# Patient Record
Sex: Male | Born: 1960 | Race: White | Hispanic: No | Marital: Married | Smoking: Former smoker
Health system: Southern US, Community
[De-identification: ages and names within clinical notes are randomized; demographics above are authoritative.]

## PROBLEM LIST (undated history)

## (undated) HISTORY — PX: OTHER SURGICAL HISTORY: SHX169

---

## 2017-08-11 DIAGNOSIS — B029 Zoster without complications: Secondary | ICD-10-CM

## 2017-08-11 HISTORY — DX: Zoster without complications: B02.9

## 2019-06-28 ENCOUNTER — Inpatient Hospital Stay (HOSPITAL_COMMUNITY)
Admission: EM | Admit: 2019-06-28 | Discharge: 2019-06-30 | DRG: 247 | Disposition: A | Discharge status: Home / Self Care | Payer: Commercial Managed Care - PPO | Attending: Interventional Cardiology | Admitting: Interventional Cardiology

## 2019-06-28 ENCOUNTER — Encounter (HOSPITAL_COMMUNITY): Payer: Self-pay | Admitting: Physician Assistant

## 2019-06-28 ENCOUNTER — Ambulatory Visit (INDEPENDENT_AMBULATORY_CARE_PROVIDER_SITE_OTHER)
Admission: EM | Admit: 2019-06-28 | Discharge: 2019-06-28 | Disposition: A | Discharge status: Home / Self Care | Payer: Commercial Managed Care - PPO | Source: Home / Self Care

## 2019-06-28 ENCOUNTER — Other Ambulatory Visit: Payer: Self-pay

## 2019-06-28 ENCOUNTER — Emergency Department (HOSPITAL_COMMUNITY): Payer: Commercial Managed Care - PPO

## 2019-06-28 DIAGNOSIS — E785 Hyperlipidemia, unspecified: Secondary | ICD-10-CM | POA: Diagnosis present

## 2019-06-28 DIAGNOSIS — Z720 Tobacco use: Secondary | ICD-10-CM | POA: Diagnosis not present

## 2019-06-28 DIAGNOSIS — Z20828 Contact with and (suspected) exposure to other viral communicable diseases: Secondary | ICD-10-CM | POA: Diagnosis present

## 2019-06-28 DIAGNOSIS — Z95820 Peripheral vascular angioplasty status with implants and grafts: Secondary | ICD-10-CM

## 2019-06-28 DIAGNOSIS — I2510 Atherosclerotic heart disease of native coronary artery without angina pectoris: Secondary | ICD-10-CM | POA: Diagnosis present

## 2019-06-28 DIAGNOSIS — F17210 Nicotine dependence, cigarettes, uncomplicated: Secondary | ICD-10-CM | POA: Diagnosis present

## 2019-06-28 DIAGNOSIS — I214 Non-ST elevation (NSTEMI) myocardial infarction: Secondary | ICD-10-CM | POA: Diagnosis present

## 2019-06-28 DIAGNOSIS — F17200 Nicotine dependence, unspecified, uncomplicated: Secondary | ICD-10-CM

## 2019-06-28 DIAGNOSIS — Z955 Presence of coronary angioplasty implant and graft: Secondary | ICD-10-CM

## 2019-06-28 DIAGNOSIS — E782 Mixed hyperlipidemia: Secondary | ICD-10-CM | POA: Diagnosis not present

## 2019-06-28 DIAGNOSIS — Z823 Family history of stroke: Secondary | ICD-10-CM

## 2019-06-28 DIAGNOSIS — R030 Elevated blood-pressure reading, without diagnosis of hypertension: Secondary | ICD-10-CM

## 2019-06-28 DIAGNOSIS — I10 Essential (primary) hypertension: Secondary | ICD-10-CM | POA: Diagnosis present

## 2019-06-28 DIAGNOSIS — I161 Hypertensive emergency: Secondary | ICD-10-CM

## 2019-06-28 DIAGNOSIS — Z20822 Contact with and (suspected) exposure to covid-19: Secondary | ICD-10-CM

## 2019-06-28 DIAGNOSIS — I200 Unstable angina: Secondary | ICD-10-CM

## 2019-06-28 DIAGNOSIS — R1084 Generalized abdominal pain: Secondary | ICD-10-CM

## 2019-06-28 HISTORY — DX: Essential (primary) hypertension: I10

## 2019-06-28 HISTORY — DX: Tobacco use: Z72.0

## 2019-06-28 HISTORY — DX: Non-ST elevation (NSTEMI) myocardial infarction: I21.4

## 2019-06-28 LAB — BASIC METABOLIC PANEL
Anion gap: 16 — ABNORMAL HIGH (ref 5–15)
BUN: 10 mg/dL (ref 6–20)
CO2: 22 mmol/L (ref 22–32)
Calcium: 9.5 mg/dL (ref 8.9–10.3)
Chloride: 97 mmol/L — ABNORMAL LOW (ref 98–111)
Creatinine, Ser: 0.97 mg/dL (ref 0.61–1.24)
GFR calc Af Amer: >60 mL/min (ref >60)
GFR calc non Af Amer: >60 mL/min (ref >60)
Glucose, Bld: 110 mg/dL — ABNORMAL HIGH (ref 70–99)
Potassium: 3.8 mmol/L (ref 3.5–5.1)
Sodium: 135 mmol/L (ref 135–145)

## 2019-06-28 LAB — HEMOGLOBIN A1C
Hgb A1c MFr Bld: 5.4 % (ref 4.8–5.6)
Mean Plasma Glucose: 108.28 mg/dL

## 2019-06-28 LAB — CBC
HCT: 49.2 % (ref 39.0–52.0)
Hemoglobin: 17.4 g/dL — ABNORMAL HIGH (ref 13.0–17.0)
MCH: 31.6 pg (ref 26.0–34.0)
MCHC: 35.4 g/dL (ref 30.0–36.0)
MCV: 89.5 fL (ref 80.0–100.0)
Platelets: 321 10*3/uL (ref 150–400)
RBC: 5.5 MIL/uL (ref 4.22–5.81)
RDW: 12.6 % (ref 11.5–15.5)
WBC: 13.2 10*3/uL — ABNORMAL HIGH (ref 4.0–10.5)
nRBC: 0 % (ref 0.0–0.2)

## 2019-06-28 LAB — TROPONIN I (HIGH SENSITIVITY)
Troponin I (High Sensitivity): 2662 ng/L (ref <18)
Troponin I (High Sensitivity): 2937 ng/L (ref <18)
Troponin I (High Sensitivity): 3100 ng/L (ref <18)

## 2019-06-28 LAB — SARS CORONAVIRUS 2 BY RT PCR (HOSPITAL ORDER, PERFORMED IN ~~LOC~~ HOSPITAL LAB): SARS Coronavirus 2: NEGATIVE

## 2019-06-28 MED ORDER — ATORVASTATIN CALCIUM 40 MG PO TABS: 40.0000 mg | ORAL_TABLET | Freq: Every day | ORAL | Status: DC

## 2019-06-28 MED ORDER — NITROGLYCERIN 0.4 MG SL SUBL: 0.4000 mg | SUBLINGUAL_TABLET | SUBLINGUAL | Status: DC | PRN

## 2019-06-28 MED ORDER — ALPRAZOLAM 0.25 MG PO TABS: 0.2500 mg | ORAL_TABLET | Freq: Two times a day (BID) | ORAL | Status: DC | PRN

## 2019-06-28 MED ORDER — SODIUM CHLORIDE 0.9 % IV SOLN: 250.0000 mL | INTRAVENOUS | Status: DC | PRN

## 2019-06-28 MED ORDER — CARVEDILOL 3.125 MG PO TABS
3.1250 mg | ORAL_TABLET | Freq: Two times a day (BID) | ORAL | Status: DC
  Administered 2019-06-28 – 2019-06-29 (×2): 3.125 mg via ORAL
  Filled 2019-06-28 (×3): qty 1

## 2019-06-28 MED ORDER — HEPARIN BOLUS VIA INFUSION
4000.0000 [IU] | Freq: Once | INTRAVENOUS | Status: AC
  Administered 2019-06-28: 4000 [IU] via INTRAVENOUS
  Filled 2019-06-28: qty 4000

## 2019-06-28 MED ORDER — ASPIRIN 81 MG PO CHEW
324.0000 mg | CHEWABLE_TABLET | Freq: Once | ORAL | Status: AC
  Administered 2019-06-28: 17:00:00 324 mg via ORAL
  Filled 2019-06-28: qty 4

## 2019-06-28 MED ORDER — SODIUM CHLORIDE 0.9 % WEIGHT BASED INFUSION
3.0000 mL/kg/h | INTRAVENOUS | Status: DC
  Administered 2019-06-29: 06:00:00 3 mL/kg/h via INTRAVENOUS

## 2019-06-28 MED ORDER — ACETAMINOPHEN 325 MG PO TABS: 650.0000 mg | ORAL_TABLET | ORAL | Status: DC | PRN

## 2019-06-28 MED ORDER — SODIUM CHLORIDE 0.9 % WEIGHT BASED INFUSION
1.0000 mL/kg/h | INTRAVENOUS | Status: DC
  Administered 2019-06-29: 08:00:00 1 mL/kg/h via INTRAVENOUS

## 2019-06-28 MED ORDER — SODIUM CHLORIDE 0.9% FLUSH: 3.0000 mL | INTRAVENOUS | Status: DC | PRN

## 2019-06-28 MED ORDER — SODIUM CHLORIDE 0.9% FLUSH: 3.0000 mL | Freq: Two times a day (BID) | INTRAVENOUS | Status: DC

## 2019-06-28 MED ORDER — HEPARIN (PORCINE) 25000 UT/250ML-% IV SOLN
1300.0000 [IU]/h | INTRAVENOUS | Status: DC
  Administered 2019-06-28: 1000 [IU]/h via INTRAVENOUS
  Filled 2019-06-28: qty 250

## 2019-06-28 MED ORDER — ZOLPIDEM TARTRATE 5 MG PO TABS: 5.0000 mg | ORAL_TABLET | Freq: Every evening | ORAL | Status: DC | PRN

## 2019-06-28 MED ORDER — ASPIRIN 81 MG PO CHEW
81.0000 mg | CHEWABLE_TABLET | ORAL | Status: AC
  Administered 2019-06-29: 06:00:00 81 mg via ORAL
  Filled 2019-06-28: qty 1

## 2019-06-28 MED ORDER — ONDANSETRON HCL 4 MG/2ML IJ SOLN: 4.0000 mg | Freq: Four times a day (QID) | INTRAMUSCULAR | Status: DC | PRN

## 2019-06-28 MED ORDER — SODIUM CHLORIDE 0.9% FLUSH: 3.0000 mL | Freq: Once | INTRAVENOUS | Status: DC

## 2019-06-28 NOTE — ED Notes (Signed)
Matthew Stanley, [place] bedside for evaluation. EKG obtained.

## 2019-06-28 NOTE — ED Triage Notes (Signed)
Pt here for intermittent chest pain over the last one and a half weeks, always relieved by gas-x. Pt was constipated and took some ducolax with much relief. Then yesterday pt had severe upper central abdominal pain unrelieved by medications for several hours. Pt vomited last night. No pain in triage.

## 2019-06-28 NOTE — ED Provider Notes (Signed)
[facility] EMERGENCY DEPARTMENT Provider Note   CSN: [id number] Arrival date & time: 06/28/19  1453     History   Chief Complaint Chief Complaint  Patient presents with  . Chest Pain    HPI Matthew Stanley is a 58 y.o. male.     Pt presents to the ED today with CP that occurred 2 days ago.  The pt said he felt like there was something sitting on his chest and he had pain radiating to his left arm.  The pt also had some constipation, took a dulcolax, had a bm and cp went away.  He thought the pain was from the constipation.  Pt went to UC today because he's had intermittent abdominal pain.  He denies any cp or abdominal pain now.  He has not been to a pcp in several years.  He does smoke, but has no other medical problems.  He is hypertensive now.  He was sent over with a nurse from UC.     Past Medical History:  Diagnosis Date  . HTN (hypertension) - new dx 06/28/2019  . NSTEMI (non-ST elevated myocardial infarction) (HCC) 06/28/2019  . Shingles 2019  . Tobacco use 06/28/2019    Patient Active Problem List   Diagnosis Date Noted  . NSTEMI (non-ST elevated myocardial infarction) (HCC) 06/28/2019  . HTN (hypertension) - new dx 06/28/2019  . Tobacco use 06/28/2019    Past Surgical History:  Procedure Laterality Date  . None          Home Medications    Prior to Admission medications   Not on File    Family History Family History  Problem Relation Age of Onset  . Stroke Mother        died in his 72s  . Heart Problems Father        Died in her 48s    Social History Social History   Tobacco Use  . Smoking status: Current Every Day Smoker    Packs/day: 1.00    Years: 40.00    Pack years: 40.00    Types: Cigarettes  . Smokeless tobacco: Never Used  Substance Use Topics  . Alcohol use: Yes    Alcohol/week: 1.0 standard drinks    Types: 1 Cans of beer per week    Comment: rare  . Drug use: Never     Allergies   Patient has no  known allergies.   Review of Systems Review of Systems  Cardiovascular: Positive for chest pain.  Gastrointestinal: Positive for abdominal pain.  All other systems reviewed and are negative.    Physical Exam Updated Vital Signs BP (!) 148/105   Pulse 88   Temp 98.5 F (36.9 C) (Oral)   Resp 15   Ht 5\' 11"  (1.803 m)   Wt 83.9 kg   SpO2 97%   BMI 25.80 kg/m   Physical Exam Vitals signs and nursing note reviewed.  Constitutional:      Appearance: He is well-developed and normal weight.  HENT:     Head: Normocephalic and atraumatic.  Eyes:     Extraocular Movements: Extraocular movements intact.     Pupils: Pupils are equal, round, and reactive to light.  Neck:     Musculoskeletal: Normal range of motion and neck supple.  Cardiovascular:     Rate and Rhythm: Normal rate and regular rhythm.     Heart sounds: Normal heart sounds.  Pulmonary:     Effort: Pulmonary effort is normal.  Breath sounds: Normal breath sounds.  Abdominal:     General: Bowel sounds are normal.     Palpations: Abdomen is soft.  Musculoskeletal: Normal range of motion.  Skin:    General: Skin is warm.     Capillary Refill: Capillary refill takes less than 2 seconds.  Neurological:     General: No focal deficit present.     Mental Status: He is alert and oriented to person, place, and time.  Psychiatric:        Mood and Affect: Mood normal.        Behavior: Behavior normal.      ED Treatments / Results  Labs (all labs ordered are listed, but only abnormal results are displayed) Labs Reviewed  BASIC METABOLIC PANEL - Abnormal; Notable for the following components:      Result Value   Chloride 97 (*)    Glucose, Bld 110 (*)    Anion gap 16 (*)    All other components within normal limits  CBC - Abnormal; Notable for the following components:   WBC 13.2 (*)    Hemoglobin 17.4 (*)    All other components within normal limits  TROPONIN I (HIGH SENSITIVITY) - Abnormal; Notable for  the following components:   Troponin I (High Sensitivity) 2,662 (*)    All other components within normal limits  TROPONIN I (HIGH SENSITIVITY) - Abnormal; Notable for the following components:   Troponin I (High Sensitivity) 2,937 (*)    All other components within normal limits  SARS CORONAVIRUS 2 BY RT PCR (HOSPITAL ORDER, PERFORMED IN [facility])  HEPARIN LEVEL (UNFRACTIONATED)  CBC    EKG EKG Interpretation  Date/Time:  Tuesday June 28 2019 15:01:01 EST Ventricular Rate:  105 PR Interval:  150 QRS Duration: 94 QT Interval:  346 QTC Calculation: 457 R Axis:   81 Text Interpretation: Sinus tachycardia Right atrial enlargement Borderline ECG No significant change since last tracing Confirmed by Jacalyn LefevreHaviland, Moustafa Mossa [id number]) on 06/28/2019 4:53:10 PM   Radiology Dg Chest 2 View  Result Date: 06/28/2019 CLINICAL DATA:  Chest and abdominal pain EXAM: CHEST - 2 VIEW COMPARISON:  None. FINDINGS: The heart size and mediastinal contours are within normal limits. Both lungs are clear. The visualized skeletal structures are unremarkable. Calcified hilar nodes suggesting prior granulomatous disease. IMPRESSION: No active cardiopulmonary disease. Electronically Signed   By: Jasmine PangKim  Fujinaga M.D.   On: 06/28/2019 15:30    Procedures Procedures (including critical care time)  Medications Ordered in ED Medications  sodium chloride flush (NS) 0.9 % injection 3 mL (3 mLs Intravenous Not Given 06/28/19 1707)  heparin ADULT infusion 100 units/mL (25000 units/25450mL sodium chloride 0.45%) (1,000 Units/hr Intravenous New Bag/Given 06/28/19 1741)  aspirin chewable tablet 324 mg (324 mg Oral Given 06/28/19 1715)  heparin bolus via infusion 4,000 Units (4,000 Units Intravenous Bolus from Bag 06/28/19 1743)     Initial Impression / Assessment and Plan / ED Course  I have reviewed the triage vital signs and the nursing notes.  Pertinent labs & imaging results that were available during  my care of the patient were reviewed by me and considered in my medical decision making (see chart for details).     Pt given ASA and started on heparin.  He is currently CP free.  Pt d/w Dr. Eldridge DaceVaranasi (cards) who will see him.  Cards plans on admission with a cath.  CRITICAL CARE Performed by: Jacalyn LefevreJulie Lenay Lovejoy   Total critical care time: 30 minutes  Critical care time was exclusive of separately billable procedures and treating other patients.  Critical care was necessary to treat or prevent imminent or life-threatening deterioration.  Critical care was time spent personally by me on the following activities: development of treatment plan with patient and/or surrogate as well as nursing, discussions with consultants, evaluation of patient's response to treatment, examination of patient, obtaining history from patient or surrogate, ordering and performing treatments and interventions, ordering and review of laboratory studies, ordering and review of radiographic studies, pulse oximetry and re-evaluation of patient's condition.  Final Clinical Impressions(s) / ED Diagnoses   Final diagnoses:  NSTEMI (non-ST elevated myocardial infarction) ([place])  Tobacco abuse  Essential hypertension    ED Discharge Orders    None       Isla Pence, MD 06/28/19 1843

## 2019-06-28 NOTE — ED Notes (Signed)
Dr Gilford Raid notified of critical troponin

## 2019-06-28 NOTE — H&P (Addendum)
Cardiology Admission History and Physical:   Patient ID: Matthew Stanley; MRN: [id number]; DOB: 1960/11/09   Admission date: 06/28/2019  Primary Care Provider: Patient, No Pcp Per Primary Cardiologist: Lance Muss, MD New Primary Electrophysiologist: None    Chief Complaint:  NSTEMI  Patient Profile:   Matthew Stanley is a 58 y.o. male with a history of no recent medical care. Gets yearly cholesterol and BP checks thru his work.   History of Present Illness:   Mr. Breton had a couple of episodes of sharp upper abdominal pain, not exertional. They lasted about 3 hr each, 2 days apart. That was about 10 days ago. Felt cold with the abd pain, had some diaphoresis, had some N&V. 8/10  2 days after the last abdominal pain, he got chest pain, pressure, up to 4-5/10. No change w/ deep inspiration. Pain radiated to the backs of both arms. Pt felt taking deep breaths would help. He was burping a lot, Gas-X would help the pain. No SOB, no diaphoresis, no N&V. No CP in 2 days.  He was having problems w/ constipation. He took Dulcolax, felt much better the next day, after having a BM. He at a large dinner yesterday, T-bone steak.   He developed abd pain at 1 am, he tried Gas-X, Pepto, no help. 8/10, ambulation, position change no help. He had N&V, but no SOB/diaphoresis with this. He felt cold.   He does some yard work at times, mows, etc. No hx CP/abd pain or SOB with this.   No hx LE edema, orthopnea or PND.   No palpitations, no presyncope or syncope.   Past Medical History:  Diagnosis Date  . HTN (hypertension) - new dx 06/28/2019  . NSTEMI (non-ST elevated myocardial infarction) (HCC) 06/28/2019  . Shingles 2019  . Tobacco use 06/28/2019    Past Surgical History:  Procedure Laterality Date  . None       Medications Prior to Admission: Prior to Admission medications   Gas-X, occ Aleve     Allergies:   No Known Allergies  Social History:   Social History    Socioeconomic History  . Marital status: Married    Spouse name: Not on file  . Number of children: Not on file  . Years of education: Not on file  . Highest education level: Not on file  Occupational History  . Occupation: Civil engineer, contracting: Nurse, children's  Social Needs  . Financial resource strain: Not on file  . Food insecurity    Worry: Not on file    Inability: Not on file  . Transportation needs    Medical: Not on file    Non-medical: Not on file  Tobacco Use  . Smoking status: Current Every Day Smoker    Packs/day: 1.00    Years: 40.00    Pack years: 40.00    Types: Cigarettes  . Smokeless tobacco: Never Used  Substance and Sexual Activity  . Alcohol use: Yes    Alcohol/week: 1.0 standard drinks    Types: 1 Cans of beer per week    Comment: rare  . Drug use: Never  . Sexual activity: Not on file  Lifestyle  . Physical activity    Days per week: Not on file    Minutes per session: Not on file  . Stress: Not on file  Relationships  . Social Musician on phone: Not on file    Gets together: Not on file  Attends religious service: Not on file    Active member of club or organization: Not on file    Attends meetings of clubs or organizations: Not on file    Relationship status: Not on file  . Intimate partner violence    Fear of current or ex partner: Not on file    Emotionally abused: Not on file    Physically abused: Not on file    Forced sexual activity: Not on file  Other Topics Concern  . Not on file  Social History Narrative  . Not on file    Family History:  The patient's family history includes Heart Problems in his father; Stroke in his mother.   The patient He indicated that his mother is deceased. He indicated that his father is deceased. He indicated that his sister is deceased. He indicated that his brother is deceased.    ROS:  Please see the history of present illness.  All other ROS reviewed and  negative.     Physical Exam/Data:   Vitals:   06/28/19 1456 06/28/19 1700 06/28/19 1730 06/28/19 1745  BP: (!) 189/109  (!) 165/106 (!) 179/109  Pulse: 98  87 98  Resp: 16  19 15   Temp: 98.5 F (36.9 C)     TempSrc: Oral     SpO2: 100%  97% 97%  Weight:  83.9 kg    Height:  5\' 11"  (1.803 m)     No intake or output data in the 24 hours ending 06/28/19 1814 Filed Weights   06/28/19 1700  Weight: 83.9 kg   Body mass index is 25.8 kg/m.  General:  Well nourished, well developed, male in no acute distress HEENT: normal Lymph: no adenopathy Neck:  JVD not elevated Endocrine:  No thryomegaly Vascular: No carotid bruits; 4/4 extremity pulses 2+ bilaterally  Cardiac:  normal S1, S2; RRR; no murmur, no rub or gallop  Lungs: diffuse insp/exp wheezing bilaterally, no rhonchi or rales  Abd: soft, nontender, no hepatomegaly  Ext: no edema Musculoskeletal:  No deformities, BUE and BLE strength normal and equal Skin: warm and dry  Neuro:  CNs 2-12 intact, no focal abnormalities noted Psych:  Normal affect    EKG:  The ECG was personally reviewed: 11/17, ST, HR 105, Q waves in multiple leads (?significance), no old available for review  Relevant CV Studies:  none  Laboratory Data:  Chemistry Recent Labs  Lab 06/28/19 1524  NA 135  K 3.8  CL 97*  CO2 22  GLUCOSE 110*  BUN 10  CREATININE 0.97  CALCIUM 9.5  GFRNONAA >60  GFRAA >60  ANIONGAP 16*    No results for input(s): PROT, ALBUMIN, AST, ALT, ALKPHOS, BILITOT in the last 168 hours. Hematology Recent Labs  Lab 06/28/19 1524  WBC 13.2*  RBC 5.50  HGB 17.4*  HCT 49.2  MCV 89.5  MCH 31.6  MCHC 35.4  RDW 12.6  PLT 321   Cardiac Enzymes High Sensitivity Troponin:   Recent Labs  Lab 06/28/19 1524  TROPONINIHS 2,662*   Radiology/Studies:  Dg Chest 2 View  Result Date: 06/28/2019 CLINICAL DATA:  Chest and abdominal pain EXAM: CHEST - 2 VIEW COMPARISON:  None. FINDINGS: The heart size and mediastinal  contours are within normal limits. Both lungs are clear. The visualized skeletal structures are unremarkable. Calcified hilar nodes suggesting prior granulomatous disease. IMPRESSION: No active cardiopulmonary disease. Electronically Signed   By: Donavan Foil M.D.   On: 06/28/2019 15:30    Assessment  and Plan:   1. NSTEMI - Admit, continue heparin, ASA - add BB and high-dose statin - add nitrates if has more pain - unclear if abd or chest pain is his angina, may well be the abd pain - add PPI in case GERD playing a role - Cardiac catheterization was discussed with the patient fully. The patient understands that risks include but are not limited to stroke (1 in 1000), death (1 in 1000), kidney failure [usually temporary] (1 in 500), bleeding (1 in 200), allergic reaction [possibly serious] (1 in 200).  The patient understands and is willing to proceed.   - cath in am  2. HTN - New dx, but pt does not ck BP - add BB and see how tolerated  3. tob use - will use nicotine patch - pt wants Chantix, discuss w/ MD - cessation strongly advised   Principal Problem:   NSTEMI (non-ST elevated myocardial infarction) (HCC) Active Problems:   HTN (hypertension) - new dx   Tobacco use     For questions or updates, please contact [facility] Please consult [email or web address] for contact info under Cardiology/STEMI.    Signed, Theodore DemarkRhonda Barrett, PA-C  06/28/2019 6:14 PM   I have examined the patient and reviewed assessment and plan and discussed with patient.  Agree with above as stated.    Sx concerning for unstable angina with elevated troponin.  NSTEMI to be treated with heparin, smoking cessation.  Plan for cath in AM.    Cardiac catheterization was discussed with the patient fully. The patient understands that risks include but are not limited to stroke (1 in 1000), death (1 in 1000), kidney failure [usually temporary] (1 in 500), bleeding (1 in 200), allergic reaction [possibly  serious] (1 in 200).  The patient understands and is willing to proceed.    BP to be controlled as well.  Will add beta blocker.  Will need high dose statin as well.   Wheezing on exam.  I suspect he has some degree of COPD given the long smoking history.  Lance MussJayadeep Fedora Knisely

## 2019-06-28 NOTE — Progress Notes (Signed)
ANTICOAGULATION CONSULT NOTE   Pharmacy Consult for Heparin Indication: chest pain/ACS  No Known Allergies  Patient Measurements: Height: 5\' 11"  (180.3 cm) Weight: 185 lb (83.9 kg) IBW/kg (Calculated) : 75.3 Heparin Dosing Weight: 83.9kg  Vital Signs: Temp: 98.5 F (36.9 C) (11/17 1456) Temp Source: Oral (11/17 1456) BP: 189/109 (11/17 1456) Pulse Rate: 98 (11/17 1456)  Labs: Recent Labs    06/28/19 1524  HGB 17.4*  HCT 49.2  PLT 321  CREATININE 0.97  TROPONINIHS 2,662*    Estimated Creatinine Clearance: 87.3 mL/min (by C-G formula based on SCr of 0.97 mg/dL).   Medical History: No past medical history on file.  Medications:  Scheduled:  . heparin  4,000 Units Intravenous Once  . sodium chloride flush  3 mL Intravenous Once    Assessment: Patient is a 62 yom that presented to the ED with chest pain. The patient has not followed up with his pcp in years. The patient was found to have a NSTEMI and pharmacy has been asked to dose heparin at this time.   Goal of Therapy:  Heparin level 0.3-0.7 units/ml Monitor platelets by anticoagulation protocol: Yes   Plan:  - Bolus heparin 4000 units IV x 1 dose  - Follow with heparin drip @ 1000 units/hr  - Check heparin level 6 hours after start of heparin  - Monitor for s/s of bleeding and CBC daily while on heparin   Duanne Limerick PharmD. BCPS 06/28/2019,5:27 PM

## 2019-06-28 NOTE — ED Triage Notes (Signed)
Patient presents to Urgent Care with complaints of centralized chest pain intermittently since the past week and a half. Patient reports the first time it happened was in the middle of the night and it caused him to vomit violently. Pt has been using gas-x and that helped at first but then it stopped helping, pt still has the chest pains intermittently, has been using other gas/abdominal pain related otc meds, pt did vomit this morning.

## 2019-06-28 NOTE — ED Provider Notes (Signed)
Matthew Stanley   MRN: [id number] DOB: 1961/04/23  Subjective:   Matthew Stanley is a 58 y.o. male presenting for 1 week history of generalized intermittent abdominal pain.  Symptoms then moved to both of his arms posteriorly and then he started to have moderate chest pressure type chest pain across his entire lower chest.  Symptoms were happening at bedtime or waking him up out of his sleep and would last for a few hours.  He subsequently started to take Gas-X and would start to get relief after about 15 to 20 minutes.  However the symptoms keep recurring.  Last episode with chest pain was about 2 days ago.  However he did have recurrent abdominal pain yesterday which resolved again with using Gas-X.  Of note, patient denies current chest or abdominal pain at this moment.  Patient is not currently taking any medications.  Denies past medical history.  Denies past surgical history.  Family history is positive for stroke with his father in his 57s, mother had possible patent foramen ovale.  Patient smokes 1 pack/day, has occasional/rare alcohol use.  ROS Denies fever, headache, dizziness, confusion, vision change, runny or stuffy nose, cough, throat pain, shortness of breath, wheezing, nausea, vomiting, diaphoresis, weakness.  Objective:   Vitals: BP (!) 179/108 (BP Location: Left Arm)   Pulse 94   Temp 98.2 F (36.8 C) (Oral)   Resp 16   SpO2 100%   BP Readings from Last 3 Encounters:  06/28/19 (!) 179/108   Blood pressure was 208/112 on initial check.  Physical Exam Constitutional:      General: He is not in acute distress.    Appearance: Normal appearance. He is well-developed. He is not ill-appearing, toxic-appearing or diaphoretic.  HENT:     Head: Normocephalic and atraumatic.     Right Ear: External ear normal.     Left Ear: External ear normal.     Nose: Nose normal.     Mouth/Throat:     Mouth: Mucous membranes are moist.     Pharynx: Oropharynx is clear.   Eyes:     General: No scleral icterus.    Extraocular Movements: Extraocular movements intact.     Pupils: Pupils are equal, round, and reactive to light.  Cardiovascular:     Rate and Rhythm: Normal rate and regular rhythm.     Heart sounds: Normal heart sounds. No murmur. No friction rub. No gallop.   Pulmonary:     Effort: Pulmonary effort is normal. No respiratory distress.     Breath sounds: Normal breath sounds. No stridor. No wheezing, rhonchi or rales.  Abdominal:     General: Bowel sounds are normal. There is no distension.     Palpations: Abdomen is soft. There is no mass.     Tenderness: There is no abdominal tenderness. There is no guarding or rebound.  Musculoskeletal:     Right lower leg: No edema.     Left lower leg: No edema.  Skin:    General: Skin is warm and dry.  Neurological:     Mental Status: He is alert and oriented to person, place, and time.     Cranial Nerves: No cranial nerve deficit.     Motor: No weakness.  Psychiatric:        Mood and Affect: Mood normal. Mood is not anxious.        Behavior: Behavior normal. Behavior is not agitated.        Thought Content: Thought content normal.  Judgment: Judgment normal.     ED ECG REPORT    Date: 06/28/2019  Rate: 92bpm  Rhythm: normal sinus rhythm  QRS Axis: normal  Intervals: normal  ST/T Wave abnormalities: nonspecific T wave changes  Conduction Disutrbances:none  Narrative Interpretation: Sinus rhythm at 92 bpm, T wave inversion at aVL, possible LVH.  Old EKG Reviewed: none available  I have personally reviewed the EKG tracing and agree with the computerized printout as noted.   Assessment and Plan :   1. Unstable angina (HCC)   2. Generalized abdominal pain   3. Hypertensive emergency   4. Elevated blood pressure reading without diagnosis of hypertension     Case and EKG reviewed with Dr. Tracie Harrier. Redirected patient to the emergency room with RN Stacie Acres transporting patient directly.   Patient is not having current chest or abdominal pain and therefore did not warrant transport by ambulance/EMS.  He has concerning symptoms for impending ACS, thoracic aneurysm, abdominal aneurysm or other acute cardiopulmonary event that needs immediate attention given his severely elevated blood pressure.   Wallis Bamberg, PA-C 06/28/19 1455

## 2019-06-28 NOTE — ED Notes (Signed)
Patient is being discharged from the [facility] and sent to the Emergency Department via wheelchair by staff. Per Bess Harvest, patient is stable but in need of higher level of care due to intermittent chest pain that radiates to both arms, nausea, vomiting, hx of smoking and significantly elevated BP w/ no history of same. Patient is aware and verbalizes understanding of plan of care.  Vitals:   06/28/19 1425 06/28/19 1436  BP: (S) (!) 208/112 (!) 179/108  Pulse: 91 94  Resp: 16   Temp: 98.2 F (36.8 C)   SpO2: 100% 100%

## 2019-06-29 ENCOUNTER — Inpatient Hospital Stay (HOSPITAL_COMMUNITY): Payer: Commercial Managed Care - PPO

## 2019-06-29 ENCOUNTER — Encounter (HOSPITAL_COMMUNITY): Payer: Self-pay | Admitting: General Practice

## 2019-06-29 ENCOUNTER — Inpatient Hospital Stay (HOSPITAL_COMMUNITY)
Admission: EM | Disposition: A | Discharge status: Home / Self Care | Payer: Self-pay | Source: Home / Self Care | Attending: Interventional Cardiology

## 2019-06-29 DIAGNOSIS — I2510 Atherosclerotic heart disease of native coronary artery without angina pectoris: Secondary | ICD-10-CM

## 2019-06-29 DIAGNOSIS — I214 Non-ST elevation (NSTEMI) myocardial infarction: Secondary | ICD-10-CM

## 2019-06-29 HISTORY — PX: CORONARY THROMBECTOMY: CATH118304

## 2019-06-29 HISTORY — PX: LEFT HEART CATH AND CORONARY ANGIOGRAPHY: CATH118249

## 2019-06-29 HISTORY — PX: CORONARY STENT INTERVENTION: CATH118234

## 2019-06-29 LAB — COMPREHENSIVE METABOLIC PANEL
ALT: 30 U/L (ref 0–44)
AST: 32 U/L (ref 15–41)
Albumin: 3.4 g/dL — ABNORMAL LOW (ref 3.5–5.0)
Alkaline Phosphatase: 56 U/L (ref 38–126)
Anion gap: 11 (ref 5–15)
BUN: 12 mg/dL (ref 6–20)
CO2: 24 mmol/L (ref 22–32)
Calcium: 8.8 mg/dL — ABNORMAL LOW (ref 8.9–10.3)
Chloride: 102 mmol/L (ref 98–111)
Creatinine, Ser: 1.01 mg/dL (ref 0.61–1.24)
GFR calc Af Amer: >60 mL/min (ref >60)
GFR calc non Af Amer: >60 mL/min (ref >60)
Glucose, Bld: 91 mg/dL (ref 70–99)
Potassium: 4.2 mmol/L (ref 3.5–5.1)
Sodium: 137 mmol/L (ref 135–145)
Total Bilirubin: 1.2 mg/dL (ref 0.3–1.2)
Total Protein: 6.1 g/dL — ABNORMAL LOW (ref 6.5–8.1)

## 2019-06-29 LAB — ECHOCARDIOGRAM COMPLETE
Height: 71 in
Weight: 2927.71 oz

## 2019-06-29 LAB — HEPARIN LEVEL (UNFRACTIONATED)
Heparin Unfractionated: 0.15 IU/mL — ABNORMAL LOW (ref 0.30–0.70)
Heparin Unfractionated: <0.1 IU/mL — ABNORMAL LOW (ref 0.30–0.70)
Heparin Unfractionated: <0.1 IU/mL — ABNORMAL LOW (ref 0.30–0.70)

## 2019-06-29 LAB — CBC
HCT: 44.6 % (ref 39.0–52.0)
Hemoglobin: 15.7 g/dL (ref 13.0–17.0)
MCH: 31.2 pg (ref 26.0–34.0)
MCHC: 35.2 g/dL (ref 30.0–36.0)
MCV: 88.7 fL (ref 80.0–100.0)
Platelets: 292 10*3/uL (ref 150–400)
RBC: 5.03 MIL/uL (ref 4.22–5.81)
RDW: 12.6 % (ref 11.5–15.5)
WBC: 9.1 10*3/uL (ref 4.0–10.5)
nRBC: 0 % (ref 0.0–0.2)

## 2019-06-29 LAB — TROPONIN I (HIGH SENSITIVITY): Troponin I (High Sensitivity): 3391 ng/L (ref <18)

## 2019-06-29 LAB — POCT ACTIVATED CLOTTING TIME: Activated Clotting Time: 461 seconds

## 2019-06-29 LAB — HIV ANTIBODY (ROUTINE TESTING W REFLEX): HIV Screen 4th Generation wRfx: NONREACTIVE

## 2019-06-29 SURGERY — LEFT HEART CATH AND CORONARY ANGIOGRAPHY
Anesthesia: LOCAL

## 2019-06-29 MED ORDER — CARVEDILOL 6.25 MG PO TABS
6.2500 mg | ORAL_TABLET | Freq: Two times a day (BID) | ORAL | Status: DC
  Administered 2019-06-29 – 2019-06-30 (×2): 6.25 mg via ORAL
  Filled 2019-06-29 (×2): qty 1

## 2019-06-29 MED ORDER — ATORVASTATIN CALCIUM 80 MG PO TABS
80.0000 mg | ORAL_TABLET | Freq: Every day | ORAL | Status: DC
  Administered 2019-06-29: 80 mg via ORAL
  Filled 2019-06-29: qty 1

## 2019-06-29 MED ORDER — ONDANSETRON HCL 4 MG/2ML IJ SOLN: 4.0000 mg | Freq: Four times a day (QID) | INTRAMUSCULAR | Status: DC | PRN

## 2019-06-29 MED ORDER — HEPARIN BOLUS VIA INFUSION
3000.0000 [IU] | Freq: Once | INTRAVENOUS | Status: AC
  Administered 2019-06-29: 07:00:00 3000 [IU] via INTRAVENOUS
  Filled 2019-06-29: qty 3000

## 2019-06-29 MED ORDER — TIROFIBAN (AGGRASTAT) BOLUS VIA INFUSION
INTRAVENOUS | Status: DC | PRN
  Administered 2019-06-29: 2075 ug via INTRAVENOUS

## 2019-06-29 MED ORDER — HEPARIN (PORCINE) IN NACL 1000-0.9 UT/500ML-% IV SOLN
INTRAVENOUS | Status: AC
  Filled 2019-06-29: qty 1000

## 2019-06-29 MED ORDER — TIROFIBAN HCL IN NACL 5-0.9 MG/100ML-% IV SOLN
INTRAVENOUS | Status: AC | PRN
  Administered 2019-06-29: 0.15 ug/kg/min via INTRAVENOUS

## 2019-06-29 MED ORDER — VERAPAMIL HCL 2.5 MG/ML IV SOLN
INTRAVENOUS | Status: DC | PRN
  Administered 2019-06-29: 10 mL via INTRA_ARTERIAL

## 2019-06-29 MED ORDER — HEPARIN SODIUM (PORCINE) 1000 UNIT/ML IJ SOLN
INTRAMUSCULAR | Status: DC | PRN
  Administered 2019-06-29: 4500 [IU] via INTRAVENOUS

## 2019-06-29 MED ORDER — BIVALIRUDIN TRIFLUOROACETATE 250 MG IV SOLR
INTRAVENOUS | Status: AC
  Filled 2019-06-29: qty 250

## 2019-06-29 MED ORDER — SODIUM CHLORIDE 0.9 % IV SOLN: 250.0000 mL | INTRAVENOUS | Status: DC | PRN

## 2019-06-29 MED ORDER — SODIUM CHLORIDE 0.9% FLUSH: 3.0000 mL | Freq: Two times a day (BID) | INTRAVENOUS | Status: DC

## 2019-06-29 MED ORDER — TICAGRELOR 90 MG PO TABS
90.0000 mg | ORAL_TABLET | Freq: Two times a day (BID) | ORAL | Status: DC
  Administered 2019-06-29 – 2019-06-30 (×2): 90 mg via ORAL
  Filled 2019-06-29 (×2): qty 1

## 2019-06-29 MED ORDER — TICAGRELOR 90 MG PO TABS
ORAL_TABLET | ORAL | Status: AC
  Filled 2019-06-29: qty 2

## 2019-06-29 MED ORDER — SODIUM CHLORIDE 0.9 % IV SOLN
INTRAVENOUS | Status: DC
  Administered 2019-06-29 (×2): via INTRAVENOUS

## 2019-06-29 MED ORDER — VERAPAMIL HCL 2.5 MG/ML IV SOLN
INTRAVENOUS | Status: AC
  Filled 2019-06-29: qty 2

## 2019-06-29 MED ORDER — MIDAZOLAM HCL 2 MG/2ML IJ SOLN
INTRAMUSCULAR | Status: AC
  Filled 2019-06-29: qty 2

## 2019-06-29 MED ORDER — ACETAMINOPHEN 325 MG PO TABS: 650.0000 mg | ORAL_TABLET | ORAL | Status: DC | PRN

## 2019-06-29 MED ORDER — SODIUM CHLORIDE 0.9 % IV SOLN: 1.7500 mg/kg/h | INTRAVENOUS | Status: AC

## 2019-06-29 MED ORDER — MIDAZOLAM HCL 2 MG/2ML IJ SOLN
INTRAMUSCULAR | Status: DC | PRN
  Administered 2019-06-29: 1 mg via INTRAVENOUS
  Administered 2019-06-29: 2 mg via INTRAVENOUS

## 2019-06-29 MED ORDER — TIROFIBAN HCL IN NACL 5-0.9 MG/100ML-% IV SOLN
0.1500 ug/kg/min | INTRAVENOUS | Status: AC
  Administered 2019-06-29 – 2019-06-30 (×3): 0.15 ug/kg/min via INTRAVENOUS
  Filled 2019-06-29 (×3): qty 100

## 2019-06-29 MED ORDER — NITROGLYCERIN 1 MG/10 ML FOR IR/CATH LAB
INTRA_ARTERIAL | Status: AC
  Filled 2019-06-29: qty 10

## 2019-06-29 MED ORDER — TIROFIBAN HCL IN NACL 5-0.9 MG/100ML-% IV SOLN
INTRAVENOUS | Status: AC
  Filled 2019-06-29: qty 100

## 2019-06-29 MED ORDER — FENTANYL CITRATE (PF) 100 MCG/2ML IJ SOLN
INTRAMUSCULAR | Status: DC | PRN
  Administered 2019-06-29: 25 ug via INTRAVENOUS
  Administered 2019-06-29: 50 ug via INTRAVENOUS

## 2019-06-29 MED ORDER — LIDOCAINE HCL (PF) 1 % IJ SOLN
INTRAMUSCULAR | Status: DC | PRN
  Administered 2019-06-29: 2 mL

## 2019-06-29 MED ORDER — TICAGRELOR 90 MG PO TABS
ORAL_TABLET | ORAL | Status: DC | PRN
  Administered 2019-06-29: 180 mg via ORAL

## 2019-06-29 MED ORDER — SODIUM CHLORIDE 0.9% FLUSH: 3.0000 mL | INTRAVENOUS | Status: DC | PRN

## 2019-06-29 MED ORDER — HEPARIN SODIUM (PORCINE) 1000 UNIT/ML IJ SOLN
INTRAMUSCULAR | Status: AC
  Filled 2019-06-29: qty 1

## 2019-06-29 MED ORDER — LABETALOL HCL 5 MG/ML IV SOLN: 10.0000 mg | INTRAVENOUS | Status: AC | PRN

## 2019-06-29 MED ORDER — DIAZEPAM 5 MG PO TABS: 5.0000 mg | ORAL_TABLET | ORAL | Status: DC | PRN

## 2019-06-29 MED ORDER — NITROGLYCERIN 1 MG/10 ML FOR IR/CATH LAB
INTRA_ARTERIAL | Status: DC | PRN
  Administered 2019-06-29 (×4): 200 ug via INTRACORONARY

## 2019-06-29 MED ORDER — HYDRALAZINE HCL 20 MG/ML IJ SOLN: 10.0000 mg | INTRAMUSCULAR | Status: AC | PRN

## 2019-06-29 MED ORDER — IOHEXOL 350 MG/ML SOLN
INTRAVENOUS | Status: DC | PRN
  Administered 2019-06-29: 285 mL

## 2019-06-29 MED ORDER — LIDOCAINE HCL (PF) 1 % IJ SOLN
INTRAMUSCULAR | Status: AC
  Filled 2019-06-29: qty 30

## 2019-06-29 MED ORDER — ASPIRIN 81 MG PO CHEW
81.0000 mg | CHEWABLE_TABLET | Freq: Every day | ORAL | Status: DC
  Administered 2019-06-29 – 2019-06-30 (×2): 81 mg via ORAL
  Filled 2019-06-29 (×2): qty 1

## 2019-06-29 MED ORDER — BIVALIRUDIN BOLUS VIA INFUSION - CUPID
INTRAVENOUS | Status: DC | PRN
  Administered 2019-06-29: 62.25 mg via INTRAVENOUS

## 2019-06-29 MED ORDER — FENTANYL CITRATE (PF) 100 MCG/2ML IJ SOLN
INTRAMUSCULAR | Status: AC
  Filled 2019-06-29: qty 2

## 2019-06-29 MED ORDER — HEPARIN (PORCINE) IN NACL 1000-0.9 UT/500ML-% IV SOLN
INTRAVENOUS | Status: DC | PRN
  Administered 2019-06-29 (×3): 500 mL

## 2019-06-29 MED ORDER — SODIUM CHLORIDE 0.9 % IV SOLN
INTRAVENOUS | Status: AC | PRN
  Administered 2019-06-29: 125 mL/h via INTRAVENOUS

## 2019-06-29 MED ORDER — SODIUM CHLORIDE 0.9 % IV SOLN
INTRAVENOUS | Status: AC | PRN
  Administered 2019-06-29: 1.75 mg/kg/h via INTRAVENOUS
  Administered 2019-06-29: 16:00:00 1.75 mg/kg/h
  Administered 2019-06-29: 1.75 mg/kg/h via INTRAVENOUS

## 2019-06-29 SURGICAL SUPPLY — 22 items
BALLN SAPPHIRE 2.0X12 (BALLOONS) ×2
BALLN SAPPHIRE 2.5X12 (BALLOONS) ×2
BALLN SAPPHIRE ~~LOC~~ 2.25X15 (BALLOONS) ×2 IMPLANT
BALLOON SAPPHIRE 2.0X12 (BALLOONS) ×1 IMPLANT
BALLOON SAPPHIRE 2.5X12 (BALLOONS) ×1 IMPLANT
CATH EXTRAC PRONTO 5.5F 138CM (CATHETERS) ×2 IMPLANT
CATH INFINITI 5FR ANG PIGTAIL (CATHETERS) ×2 IMPLANT
CATH OPTITORQUE TIG 4.0 5F (CATHETERS) ×2 IMPLANT
CATH VISTA GUIDE 6FR XB3.5 (CATHETERS) ×2 IMPLANT
DEVICE RAD COMP TR BAND LRG (VASCULAR PRODUCTS) ×2 IMPLANT
GLIDESHEATH SLEND SS 6F .021 (SHEATH) ×2 IMPLANT
GUIDEWIRE INQWIRE 1.5J.035X260 (WIRE) ×1 IMPLANT
INQWIRE 1.5J .035X260CM (WIRE) ×2
KIT ENCORE 26 ADVANTAGE (KITS) ×2 IMPLANT
KIT HEART LEFT (KITS) ×2 IMPLANT
PACK CARDIAC CATHETERIZATION (CUSTOM PROCEDURE TRAY) ×2 IMPLANT
STENT RESOLUTE ONYX 2.25X38 (Permanent Stent) ×2 IMPLANT
TRANSDUCER W/STOPCOCK (MISCELLANEOUS) ×2 IMPLANT
TUBING CIL FLEX 10 FLL-RA (TUBING) ×2 IMPLANT
WIRE COUGAR XT STRL 190CM (WIRE) ×2 IMPLANT
WIRE HI TORQ WHISPER MS 190CM (WIRE) ×2 IMPLANT
WIRE PT2 MS 185 (WIRE) ×2 IMPLANT

## 2019-06-29 NOTE — Progress Notes (Signed)
ANTICOAGULATION CONSULT NOTE - Follow Up Consult  Pharmacy Consult for heparin Indication: NSTEMI  Labs: Recent Labs    06/28/19 1524 06/28/19 1706 06/28/19 2046 06/28/19 2343 06/29/19 0510  HGB 17.4*  --   --   --  15.7  HCT 49.2  --   --   --  44.6  PLT 321  --   --   --  292  HEPARINUNFRC  --   --   --  0.15* <0.10*  CREATININE 0.97  --   --   --  1.01  TROPONINIHS 2,662* 2,937* 3,100* 3,391*  --     Assessment: 58yo male subtherapeutic on heparin with initial dosing for NSTEMI; no gtt issues or signs of bleeding per RN; has been running smoothly since pt arrived to floor from ED.  Goal of Therapy:  Heparin level 0.3-0.7 units/ml   Plan:  Will rebolus with heparin 3000 units and increase heparin gtt by 4 units/kg/hr to 1300 units/hr and check level in 6 hours.    Wynona Neat, PharmD, BCPS  06/29/2019,6:29 AM

## 2019-06-29 NOTE — Progress Notes (Signed)
ANTICOAGULATION CONSULT NOTE   Pharmacy Consult for Heparin Indication: chest pain/ACS  No Known Allergies  Patient Measurements: Height: 5\' 11"  (180.3 cm) Weight: 182 lb 15.7 oz (83 kg) IBW/kg (Calculated) : 75.3 Heparin Dosing Weight: 83 kg  Vital Signs: Temp: 99.1 F (37.3 C) (11/17 2254) Temp Source: Oral (11/17 2254) BP: 147/98 (11/17 2254) Pulse Rate: 85 (11/17 2254)  Labs: Recent Labs    06/28/19 1524 06/28/19 1706 06/28/19 2046 06/28/19 2343  HGB 17.4*  --   --   --   HCT 49.2  --   --   --   PLT 321  --   --   --   HEPARINUNFRC  --   --   --  0.15*  CREATININE 0.97  --   --   --   TROPONINIHS 2,662* 2,937* 3,100*  --     Estimated Creatinine Clearance: 87.3 mL/min (by C-G formula based on SCr of 0.97 mg/dL).   Medical History: Past Medical History:  Diagnosis Date  . HTN (hypertension) - new dx 06/28/2019  . NSTEMI (non-ST elevated myocardial infarction) ([place]) 06/28/2019  . Shingles 2019  . Tobacco use 06/28/2019    Assessment: Patient is a 28 yom that presented to the ED with chest pain. The patient has not followed up with his pcp in years. The patient was found to have a NSTEMI and pharmacy has been asked to dose heparin at this time.   Heparin level subtherapeutic (0.15). Talked with RN on floor and he said when pt came up from ED, the heparin line was clamped so unsure if gtt was running appropriately down in the ED. Pt transferred to unit ~2230  Goal of Therapy:  Heparin level 0.3-0.7 units/ml Monitor platelets by anticoagulation protocol: Yes   Plan:  Continue heparin 1000 units/hr Check heparin level again 6 hours from when pt transferred to floor  Sherlon Handing, PharmD, BCPS Please see amion for complete clinical pharmacist phone list 06/29/2019,12:11 AM

## 2019-06-29 NOTE — Progress Notes (Signed)
Echocardiogram 2D Echocardiogram has been performed.  Oneal Deputy Navika Hoopes 06/29/2019, 9:15 AM

## 2019-06-29 NOTE — Progress Notes (Addendum)
Progress Note  Patient Name: Matthew Stanley Date of Encounter: 06/29/2019  Primary Cardiologist: Lance Muss, MD   Subjective   Waiting for cath  Inpatient Medications    Scheduled Meds: . atorvastatin  40 mg Oral q1800  . carvedilol  3.125 mg Oral BID WC  . sodium chloride flush  3 mL Intravenous Once  . sodium chloride flush  3 mL Intravenous Q12H   Continuous Infusions: . sodium chloride    . sodium chloride    . sodium chloride 1 mL/kg/hr (06/29/19 0732)  . heparin 1,300 Units/hr (06/29/19 0653)   PRN Meds: sodium chloride, sodium chloride, acetaminophen, ALPRAZolam, nitroGLYCERIN, ondansetron (ZOFRAN) IV, sodium chloride flush, sodium chloride flush, zolpidem   Vital Signs    Vitals:   06/28/19 2254 06/29/19 0558 06/29/19 0752 06/29/19 0827  BP: (!) 147/98 (!) 151/102 (!) 149/107 (!) 149/107  Pulse: 85 80 71 71  Resp: (!) 21 19 14    Temp: 99.1 F (37.3 C) 98.4 F (36.9 C) 98.4 F (36.9 C)   TempSrc: Oral Oral Oral   SpO2: 97% 96% 99%   Weight: 83 kg     Height: 5\' 11"  (1.803 m)       Intake/Output Summary (Last 24 hours) at 06/29/2019 0943 Last data filed at 06/29/2019 0600 Gross per 24 hour  Intake -  Output 700 ml  Net -700 ml   Last 3 Weights 06/28/2019 06/28/2019  Weight (lbs) 182 lb 15.7 oz 185 lb  Weight (kg) 83 kg 83.915 kg      Telemetry    SR - Personally Reviewed  ECG    SR with normal EKG - Personally Reviewed  Physical Exam  Exam per Dr. 06/30/2019 GEN: No acute distress.   Neck: No JVD Cardiac: RRR, no murmurs, rubs, or gallops.  Respiratory: Clear to auscultation bilaterally. GI: Soft, nontender, non-distended  MS: No edema; No deformity. Neuro:  Nonfocal  Psych: Normal affect   Labs    High Sensitivity Troponin:   Recent Labs  Lab 06/28/19 1524 06/28/19 1706 06/28/19 2046 06/28/19 2343  TROPONINIHS 2,662* 2,937* 3,100* 3,391*      Chemistry Recent Labs  Lab 06/28/19 1524 06/29/19 0510  NA 135 137   K 3.8 4.2  CL 97* 102  CO2 22 24  GLUCOSE 110* 91  BUN 10 12  CREATININE 0.97 1.01  CALCIUM 9.5 8.8*  PROT  --  6.1*  ALBUMIN  --  3.4*  AST  --  32  ALT  --  30  ALKPHOS  --  56  BILITOT  --  1.2  GFRNONAA >60 >60  GFRAA >60 >60  ANIONGAP 16* 11     Hematology Recent Labs  Lab 06/28/19 1524 06/29/19 0510  WBC 13.2* 9.1  RBC 5.50 5.03  HGB 17.4* 15.7  HCT 49.2 44.6  MCV 89.5 88.7  MCH 31.6 31.2  MCHC 35.4 35.2  RDW 12.6 12.6  PLT 321 292    BNPNo results for input(s): BNP, PROBNP in the last 168 hours.   DDimer No results for input(s): DDIMER in the last 168 hours.   Radiology    Dg Chest 2 View  Result Date: 06/28/2019 CLINICAL DATA:  Chest and abdominal pain EXAM: CHEST - 2 VIEW COMPARISON:  None. FINDINGS: The heart size and mediastinal contours are within normal limits. Both lungs are clear. The visualized skeletal structures are unremarkable. Calcified hilar nodes suggesting prior granulomatous disease. IMPRESSION: No active cardiopulmonary disease. Electronically Signed   By: 07/01/19  Francoise Ceo M.D.   On: 06/28/2019 15:30    Cardiac Studies   Echo pending cardiac cath pending  Patient Profile     58 y.o. male with a history of no recent medical care. Gets yearly cholesterol and BP checks thru his work, now admitted with chest pain/abd pain and troponin 3391.     Assessment & Plan    NSTEMI for cath today on IV heparin and ASA, on BB and high dose statin.  PPI added with abd pain.    HTN new pt does not see any health care provider.  Tobacco use  - discussed stopping   HLD  Lipids pending on statin now.    For questions or updates, please contact [place] Please consult [email or web address] for contact info under        Signed, Cecilie Kicks, NP  06/29/2019, 9:43 AM    I have examined the patient and reviewed assessment and plan and discussed with patient.  Agree with above as stated.    No further sx like he had that brought him to ER.   Troponins elevated but flat.  Await cath.  Likely will try Chantix at discharge to stop smoking.  Increase Coreg for HTN. High dose statin.  Larae Grooms

## 2019-06-29 NOTE — Interval H&P Note (Signed)
Cath Lab Visit (complete for each Cath Lab visit)  Clinical Evaluation Leading to the Procedure:   ACS: No.  Non-ACS:    Anginal Classification: CCS IV  Anti-ischemic medical therapy: Minimal Therapy (1 class of medications)  Non-Invasive Test Results: No non-invasive testing performed  Prior CABG: No previous CABG      History and Physical Interval Note:  06/29/2019 12:57 PM  Matthew Stanley  has presented today for surgery, with the diagnosis of NSTEMI.  The various methods of treatment have been discussed with the patient and family. After consideration of risks, benefits and other options for treatment, the patient has consented to  Procedure(s): LEFT HEART CATH AND CORONARY ANGIOGRAPHY (N/A) as a surgical intervention.  The patient's history has been reviewed, patient examined, no change in status, stable for surgery.  I have reviewed the patient's chart and labs.  Questions were answered to the patient's satisfaction.     Shelva Majestic

## 2019-06-29 NOTE — H&P (View-Only) (Signed)
Progress Note  Patient Name: Matthew Stanley Date of Encounter: 06/29/2019  Primary Cardiologist: Lance Muss, MD   Subjective   Waiting for cath  Inpatient Medications    Scheduled Meds: . atorvastatin  40 mg Oral q1800  . carvedilol  3.125 mg Oral BID WC  . sodium chloride flush  3 mL Intravenous Once  . sodium chloride flush  3 mL Intravenous Q12H   Continuous Infusions: . sodium chloride    . sodium chloride    . sodium chloride 1 mL/kg/hr (06/29/19 0732)  . heparin 1,300 Units/hr (06/29/19 0653)   PRN Meds: sodium chloride, sodium chloride, acetaminophen, ALPRAZolam, nitroGLYCERIN, ondansetron (ZOFRAN) IV, sodium chloride flush, sodium chloride flush, zolpidem   Vital Signs    Vitals:   06/28/19 2254 06/29/19 0558 06/29/19 0752 06/29/19 0827  BP: (!) 147/98 (!) 151/102 (!) 149/107 (!) 149/107  Pulse: 85 80 71 71  Resp: (!) 21 19 14    Temp: 99.1 F (37.3 C) 98.4 F (36.9 C) 98.4 F (36.9 C)   TempSrc: Oral Oral Oral   SpO2: 97% 96% 99%   Weight: 83 kg     Height: 5\' 11"  (1.803 m)       Intake/Output Summary (Last 24 hours) at 06/29/2019 0943 Last data filed at 06/29/2019 0600 Gross per 24 hour  Intake -  Output 700 ml  Net -700 ml   Last 3 Weights 06/28/2019 06/28/2019  Weight (lbs) 182 lb 15.7 oz 185 lb  Weight (kg) 83 kg 83.915 kg      Telemetry    SR - Personally Reviewed  ECG    SR with normal EKG - Personally Reviewed  Physical Exam  Exam per Dr. 06/30/2019 GEN: No acute distress.   Neck: No JVD Cardiac: RRR, no murmurs, rubs, or gallops.  Respiratory: Clear to auscultation bilaterally. GI: Soft, nontender, non-distended  MS: No edema; No deformity. Neuro:  Nonfocal  Psych: Normal affect   Labs    High Sensitivity Troponin:   Recent Labs  Lab 06/28/19 1524 06/28/19 1706 06/28/19 2046 06/28/19 2343  TROPONINIHS 2,662* 2,937* 3,100* 3,391*      Chemistry Recent Labs  Lab 06/28/19 1524 06/29/19 0510  NA 135 137   K 3.8 4.2  CL 97* 102  CO2 22 24  GLUCOSE 110* 91  BUN 10 12  CREATININE 0.97 1.01  CALCIUM 9.5 8.8*  PROT  --  6.1*  ALBUMIN  --  3.4*  AST  --  32  ALT  --  30  ALKPHOS  --  56  BILITOT  --  1.2  GFRNONAA >60 >60  GFRAA >60 >60  ANIONGAP 16* 11     Hematology Recent Labs  Lab 06/28/19 1524 06/29/19 0510  WBC 13.2* 9.1  RBC 5.50 5.03  HGB 17.4* 15.7  HCT 49.2 44.6  MCV 89.5 88.7  MCH 31.6 31.2  MCHC 35.4 35.2  RDW 12.6 12.6  PLT 321 292    BNPNo results for input(s): BNP, PROBNP in the last 168 hours.   DDimer No results for input(s): DDIMER in the last 168 hours.   Radiology    Dg Chest 2 View  Result Date: 06/28/2019 CLINICAL DATA:  Chest and abdominal pain EXAM: CHEST - 2 VIEW COMPARISON:  None. FINDINGS: The heart size and mediastinal contours are within normal limits. Both lungs are clear. The visualized skeletal structures are unremarkable. Calcified hilar nodes suggesting prior granulomatous disease. IMPRESSION: No active cardiopulmonary disease. Electronically Signed   By: 07/01/19  Francoise Ceo M.D.   On: 06/28/2019 15:30    Cardiac Studies   Echo pending cardiac cath pending  Patient Profile     58 y.o. male with a history of no recent medical care. Gets yearly cholesterol and BP checks thru his work, now admitted with chest pain/abd pain and troponin 3391.     Assessment & Plan    NSTEMI for cath today on IV heparin and ASA, on BB and high dose statin.  PPI added with abd pain.    HTN new pt does not see any health care provider.  Tobacco use  - discussed stopping   HLD  Lipids pending on statin now.    For questions or updates, please contact [place] Please consult [email or web address] for contact info under        Signed, Cecilie Kicks, NP  06/29/2019, 9:43 AM    I have examined the patient and reviewed assessment and plan and discussed with patient.  Agree with above as stated.    No further sx like he had that brought him to ER.   Troponins elevated but flat.  Await cath.  Likely will try Chantix at discharge to stop smoking.  Increase Coreg for HTN. High dose statin.  Larae Grooms

## 2019-06-30 ENCOUNTER — Encounter (HOSPITAL_COMMUNITY): Payer: Self-pay | Admitting: Cardiovascular Disease

## 2019-06-30 DIAGNOSIS — E782 Mixed hyperlipidemia: Secondary | ICD-10-CM

## 2019-06-30 DIAGNOSIS — Z95820 Peripheral vascular angioplasty status with implants and grafts: Secondary | ICD-10-CM

## 2019-06-30 DIAGNOSIS — E785 Hyperlipidemia, unspecified: Secondary | ICD-10-CM

## 2019-06-30 DIAGNOSIS — I2510 Atherosclerotic heart disease of native coronary artery without angina pectoris: Secondary | ICD-10-CM

## 2019-06-30 HISTORY — DX: Peripheral vascular angioplasty status with implants and grafts: Z95.820

## 2019-06-30 HISTORY — DX: Atherosclerotic heart disease of native coronary artery without angina pectoris: I25.10

## 2019-06-30 HISTORY — DX: Hyperlipidemia, unspecified: E78.5

## 2019-06-30 LAB — CBC
HCT: 40.8 % (ref 39.0–52.0)
Hemoglobin: 14.3 g/dL (ref 13.0–17.0)
MCH: 30.9 pg (ref 26.0–34.0)
MCHC: 35 g/dL (ref 30.0–36.0)
MCV: 88.1 fL (ref 80.0–100.0)
Platelets: 281 10*3/uL (ref 150–400)
RBC: 4.63 MIL/uL (ref 4.22–5.81)
RDW: 12.7 % (ref 11.5–15.5)
WBC: 11.1 10*3/uL — ABNORMAL HIGH (ref 4.0–10.5)
nRBC: 0 % (ref 0.0–0.2)

## 2019-06-30 LAB — BASIC METABOLIC PANEL
Anion gap: 9 (ref 5–15)
BUN: 16 mg/dL (ref 6–20)
CO2: 21 mmol/L — ABNORMAL LOW (ref 22–32)
Calcium: 8.2 mg/dL — ABNORMAL LOW (ref 8.9–10.3)
Chloride: 105 mmol/L (ref 98–111)
Creatinine, Ser: 0.91 mg/dL (ref 0.61–1.24)
GFR calc Af Amer: >60 mL/min (ref >60)
GFR calc non Af Amer: >60 mL/min (ref >60)
Glucose, Bld: 101 mg/dL — ABNORMAL HIGH (ref 70–99)
Potassium: 4 mmol/L (ref 3.5–5.1)
Sodium: 135 mmol/L (ref 135–145)

## 2019-06-30 LAB — LIPID PANEL
Cholesterol: 135 mg/dL (ref 0–200)
HDL: 37 mg/dL — ABNORMAL LOW (ref >40)
LDL Cholesterol: 81 mg/dL (ref 0–99)
Total CHOL/HDL Ratio: 3.6 RATIO
Triglycerides: 85 mg/dL (ref <150)
VLDL: 17 mg/dL (ref 0–40)

## 2019-06-30 MED ORDER — CARVEDILOL 6.25 MG PO TABS: 6.2500 mg | ORAL_TABLET | Freq: Two times a day (BID) | ORAL | 6 refills | Status: DC

## 2019-06-30 MED ORDER — ATORVASTATIN CALCIUM 80 MG PO TABS: 80.0000 mg | ORAL_TABLET | Freq: Every day | ORAL | 6 refills | Status: DC

## 2019-06-30 MED ORDER — THE SENSUOUS HEART BOOK
Freq: Once | Status: AC
  Administered 2019-06-30: 06:00:00 1
  Filled 2019-06-30: qty 1

## 2019-06-30 MED ORDER — HEART ATTACK BOUNCING BOOK
Freq: Once | Status: AC
  Administered 2019-06-30: 1
  Filled 2019-06-30: qty 1

## 2019-06-30 MED ORDER — TICAGRELOR 90 MG PO TABS: 90.0000 mg | ORAL_TABLET | Freq: Two times a day (BID) | ORAL | 3 refills | Status: DC

## 2019-06-30 MED ORDER — ANGIOPLASTY BOOK
Freq: Once | Status: AC
  Administered 2019-06-30: 06:00:00 1
  Filled 2019-06-30: qty 1

## 2019-06-30 MED ORDER — ACETAMINOPHEN 325 MG PO TABS: 650.0000 mg | ORAL_TABLET | ORAL | Status: DC | PRN

## 2019-06-30 MED ORDER — ASPIRIN 81 MG PO CHEW: 81.0000 mg | CHEWABLE_TABLET | Freq: Every day | ORAL | Status: DC

## 2019-06-30 MED ORDER — NITROGLYCERIN 0.4 MG SL SUBL: 0.4000 mg | SUBLINGUAL_TABLET | SUBLINGUAL | 4 refills | Status: DC | PRN

## 2019-06-30 MED FILL — Bivalirudin Trifluoroacetate For IV Soln 250 MG (Base Equiv): INTRAVENOUS | Qty: 250 | Status: AC

## 2019-06-30 MED FILL — NITROGLYCERIN 0.4 MG TAB SL: 0.4 | 7 days supply | Qty: 25 | Fill #0

## 2019-06-30 MED FILL — BRILINTA 90 MG TABLET: 90 | 30 days supply | Qty: 60 | Fill #0

## 2019-06-30 MED FILL — ATORVASTATIN CALCIUM 80 MG: 80 | 30 days supply | Qty: 30 | Fill #0

## 2019-06-30 MED FILL — CARVEDILOL 6.25 MG TABLET: 6.25 | 30 days supply | Qty: 60 | Fill #0

## 2019-06-30 NOTE — Discharge Instructions (Signed)
Heart Attack The heart is a muscle that needs oxygen to survive. A heart attack is a condition that occurs when your heart does not get enough oxygen. When this happens, the heart muscle begins to die. This can cause permanent damage if not treated right away. A heart attack is a medical emergency. This condition may be called a myocardial infarction, or MI. It is also known as acute coronary syndrome (ACS). ACS is a term used to describe a group of conditions that affect blood flow to the heart. What are the causes? This condition may be caused by:  Atherosclerosis. This occurs when a fatty substance called plaque builds up in the arteries and blocks or reduces blood supply to the heart.  A blood clot. A blood clot can develop suddenly when plaque breaks up within an artery and blocks blood flow to the heart.  Low blood pressure.  An abnormal heartbeat (arrhythmia).  Conditions that cause a decrease of oxygen to the heart, such as anemiaorrespiratory failure.  A spasm, or severe tightening, of a blood vessel that cuts off blood flow to the heart.  Tearing of a coronary artery (spontaneous coronary artery dissection).  High blood pressure. What increases the risk? The following factors may make you more likely to develop this condition:  Aging. The older you are, the higher your risk.  Having a personal or family history of chest pain, heart attack, stroke, or narrowing of the arteries in the legs, arms, head, or stomach (peripheral artery disease).  Being male.  Smoking.  Not getting regular exercise.  Being overweight or obese.  Having high blood pressure.  Having high cholesterol (hypercholesterolemia).  Having diabetes.  Drinking too much alcohol.  Using illegal drugs, such as cocaine or methamphetamine. What are the signs or symptoms? Symptoms of this condition may vary, depending on factors like gender and age. Symptoms may include:  Chest pain. It may feel  like: ? Crushing or squeezing. ? Tightness, pressure, fullness, or heaviness.  Pain in the arm, neck, jaw, back, or upper body.  Shortness of breath.  Heartburn or upset stomach.  Nausea.  Sudden cold sweats.  Feeling tired.  Sudden light-headedness. How is this diagnosed? This condition may be diagnosed through tests, such as:  Electrocardiogram (ECG) to measure the electrical activity of your heart.  Blood tests to check for cardiac markers. These chemicals are released by a damaged heart muscle.  A test to evaluate blood flow and heart function (coronary angiogram).  CT scan to see the heart more clearly.  A test to evaluate the pumping action of the heart (echocardiogram). How is this treated? A heart attack must be treated as soon as possible. Treatment may include:  Medicines to: ? Break up or dissolve blood clots (fibrinolytic therapy). ? Thin blood and help prevent blood clots. ? Treat blood pressure. ? Improve blood flow to the heart. ? Reduce pain. ? Reduce cholesterol.  Angioplasty and stent placement. These are procedures to widen a blocked artery and keep it open.  Coronary artery bypass graft, CABG, or open heart surgery. This enables blood to flow to the heart by going around the blocked part of the artery.  Oxygen therapy if needed.  Cardiac rehabilitation. This improves your health and well-being through exercise, education, and counseling. Follow these instructions at home: Medicines  Take over-the-counter and prescription medicines only as told by your health care provider.  Do not take the following medicines unless your health care provider says it is  okay to take them: ? NSAIDs, such as ibuprofen. ? Supplements that contain vitamin A, vitamin E, or both. ? Hormone replacement therapy that contains estrogen with or without progestin. Lifestyle   Do not use any products that contain nicotine or tobacco, such as cigarettes, e-cigarettes,  and chewing tobacco. If you need help quitting, ask your health care provider.  Avoid secondhand smoke.  Exercise regularly. Ask your health care provider about participating in a cardiac rehabilitation program that helps you start exercising safely after a heart attack.  Eat a heart-healthy diet. Your health care provider will tell you what foods to eat.  Maintain a healthy weight.  Learn ways to manage stress.  Do not use illegal drugs. Alcohol use  Do not drink alcohol if: ? Your health care provider tells you not to drink. ? You are pregnant, may be pregnant, or are planning to become pregnant.  If you drink alcohol: ? Limit how much you use to:  0-1 drink a day for women.  0-2 drinks a day for men. ? Be aware of how much alcohol is in your drink. In the [place] one drink equals one 12 oz bottle of beer (355 mL), one 5 oz glass of wine (148 mL), or one 1 oz glass of hard liquor (44 mL). General instructions  Work with your health care provider to manage any other conditions you have, such as high blood pressure or diabetes. These conditions affect your heart.  Get screened for depression, and seek treatment if needed.  Keep your vaccinations up to date. Get the flu vaccine every year.  Keep all follow-up visits as told by your health care provider. This is important. Contact a health care provider if:  You feel overwhelmed or sad.  You have trouble doing your daily activities. Get help right away if:  You have sudden, unexplained discomfort in your chest, arms, back, neck, jaw, or upper body.  You have shortness of breath.  You suddenly start to sweat or your skin gets clammy.  You feel nauseous or you vomit.  You have unexplained tiredness or weakness.  You suddenly feel light-headed or dizzy.  You notice your heart starts to beat fast or feels like it is skipping beats.  You have blood pressure that is higher than 180/120. These symptoms may represent a  serious problem that is an emergency. Do not wait to see if the symptoms will go away. Get medical help right away. Call your local emergency services (911 in the [place] Do not drive yourself to the hospital. Summary  A heart attack, also called myocardial infarction, is a condition that occurs when your heart does not get enough oxygen. This is caused by anything that blocks or reduces blood flow to the heart.  Treatment is a combination of medicines and surgeries, if needed, to open the blocked arteries and restore blood flow to the heart.  A heart attack is an emergency. Get help right away if you have sudden discomfort in your chest, arms, back, neck, jaw, or upper body. Seek help if you feel nauseous, you vomit, or you feel light-headed or dizzy. This information is not intended to replace advice given to you by your health care provider. Make sure you discuss any questions you have with your health care provider. Document Released: 07/28/2005 Document Revised: 11/04/2018 Document Reviewed: 11/08/2018 Elsevier Patient Education  2020 [employer]. Call [facility] at [phone] if any bleeding, swelling or drainage at cath site.  May shower, no tub baths for 48 hours for groin sticks. No lifting over 5 pounds for 5 days.  No Driving for 5 days  Take 1 NTG, under your tongue, while sitting.  If no relief of pain may repeat NTG, one tab every 5 minutes up to 3 tablets total over 15 minutes.  If no relief CALL 911.  If you have dizziness/lightheadness  while taking NTG, stop taking and call 911.        Heart Healthy diet  Stop smoking to help your heart  Do not stop asprin and brilinta these keep the stent open stopping could cause a heart attack

## 2019-06-30 NOTE — TOC Benefit Eligibility Note (Signed)
Transition of Care [facility]) Benefit Eligibility Note    Patient Details  Name: Matthew Stanley MRN: [id number] Date of Birth: 19-Apr-1961   Medication/Dose: BRILINTA  90 MG BID  Covered?: Yes  Tier: 2 Drug  Prescription Coverage Preferred Pharmacy: [facility]  Spoke with Person/Company/Phone Number:: LEANN  @ [employer]  [employer]H # [phone]  Co-Pay: $ 35.00  Prior Approval: No  Deductible: Met(OUT-OF-POCKET: NOT MET)       Memory Argue Phone Number: 06/30/2019, 11:37 AM

## 2019-06-30 NOTE — Plan of Care (Signed)
Pt discharged to home, d/c education complete, left facility via private vehicle.

## 2019-06-30 NOTE — Progress Notes (Signed)
CARDIAC REHAB PHASE I   Offered to walk with pt. Pt c/o nausea and epigastric pain similar to what he came in with, but less severe. States its a 4/10. Pt educated on importance of ASA, Brilinta, statin, and NTG. Pt given MI book, stent card, heart healthy diet and smoking cessation tip sheet. Pt states he and his wife have been talking, and there are plans for both to quit smoking. Reviewed restrictions, site care, and exercise guidelines. Encourage pt to ambulate prior to d/c when he is feeling a little better to ensure symptoms do not recur. Will refer to CRP II [place]. Pt is interested in participating in Virtual Cardiac and Pulmonary Rehab. Pt advised that Virtual Cardiac and Pulmonary Rehab is provided at no cost to the patient.  Checklist:  1. Pt has smart device  ie smartphone and/or ipad for downloading an app  Yes 2. Reliable internet/wifi service    Yes 3. Understands how to use their smartphone and navigate within an app.  Yes  Pt verbalized understanding and is in agreement.  [id number] Rufina Falco, RN BSN 06/30/2019 9:15 AM

## 2019-06-30 NOTE — Discharge Summary (Addendum)
Discharge Summary    Patient ID: Matthew Stanley MRN: [id number]; DOB: June 02, 1961  Admit date: 06/28/2019 Discharge date: 06/30/2019  Primary Care Provider: Patient, No Pcp Per  Primary Cardiologist: Lance Muss, MD  Primary Electrophysiologist:  None   Discharge Diagnoses    Principal Problem:   NSTEMI (non-ST elevated myocardial infarction) [facility]) Active Problems:   S/P angioplasty with stent 06/29/19 DES to LCX OM    HTN (hypertension) - new dx   Tobacco use   CAD in native artery   HLD (hyperlipidemia)    Diagnostic Studies/Procedures    Cardiac cath 06/29/19  1st Mrg lesion is 95% stenosed.  Mid Cx lesion is 20% stenosed.  Dist Cx lesion is 30% stenosed.  2nd Diag lesion is 80% stenosed.  Mid LAD lesion is 40% stenosed.  Post intervention, there is a 0% residual stenosis.  Prox RCA lesion is 20% stenosed.  A stent was successfully placed.   The LAD has mild irregularity proximally with 40% narrowing.  The second diagonal vessel is very small caliber and had 80% proximal stenosis in the vessel less than 1.5 mm.   Left circumflex vessel is a dominant vessel with subtotal ostial to proximal obtuse marginal stenosis representing the culprit lesion in the patient's ACS/non-STEMI.  There is thrombus in the AV groove circumflex with 20% narrowing, and 30% mid distal AV groove stenosis.  Small nondominant RCA with mild 20% narrowing in the region of the more marginal marginal takeoff.  Preserved global LV contractility with a subtle region of mid anterolateral hypocontractility.  LVEDP 10 mm.  Difficult but successful percutaneous coronary intervention involving the circumflex marginal vessel treated with PTCA, toe thrombectomy, with subsequent proximal marginal dissection successfully treated with ultimate stenting utilizing a 2.25 x 38 mm Resolute Onyx stent with the stenoses being reduced to 0%.  RECOMMENDATION: DAPT for minimum of 1 year.  Smoking  cessation is imperative.  Aggressive lipid-lowering therapy high potency statin therapy and with target LDL less than 80%.  Diagnostic Dominance: Left  Intervention    _____________  Echo 08/28/18  IMPRESSIONS    1. Left ventricular ejection fraction, by visual estimation, is 45 to 50%, overall mildly reduced. There is no left ventricular hypertrophy. Basal to mid inferolateral akinesis. Basal anterolateral hypokinesis.  2. Left ventricular diastolic parameters are consistent with Grade I diastolic dysfunction (impaired relaxation).  3. Global right ventricle has normal systolic function.The right ventricular size is normal. No increase in right ventricular wall thickness.  4. Left atrial size was normal.  5. Right atrial size was normal.  6. The mitral valve is normal in structure. Trace mitral valve regurgitation. No evidence of mitral stenosis.  7. The tricuspid valve is normal in structure. Tricuspid valve regurgitation is not demonstrated.  8. The aortic valve is tricuspid. Aortic valve regurgitation is not visualized. Mild aortic valve sclerosis without stenosis.  9. TR signal is inadequate for assessing pulmonary artery systolic pressure. 10. The inferior vena cava is normal in size with greater than 50% respiratory variability, suggesting right atrial pressure of 3 mmHg.  FINDINGS  Left Ventricle: Left ventricular ejection fraction, by visual estimation, is 45 to 50%. The left ventricle has mildly decreased function. The left ventricular internal cavity size was the left ventricle is normal in size. There is no left ventricular  hypertrophy. Left ventricular diastolic parameters are consistent with Grade I diastolic dysfunction (impaired relaxation).  Right Ventricle: The right ventricular size is normal. No increase in right ventricular wall thickness. Global RV systolic  function is has normal systolic function.  Left Atrium: Left atrial size was normal in size.  Right  Atrium: Right atrial size was normal in size  Pericardium: There is no evidence of pericardial effusion.  Mitral Valve: The mitral valve is normal in structure. No evidence of mitral valve stenosis by observation. Trace mitral valve regurgitation.  Tricuspid Valve: The tricuspid valve is normal in structure. Tricuspid valve regurgitation is not demonstrated.  Aortic Valve: The aortic valve is tricuspid. Aortic valve regurgitation is not visualized. Mild aortic valve sclerosis is present, with no evidence of aortic valve stenosis.  Pulmonic Valve: The pulmonic valve was normal in structure. Pulmonic valve regurgitation is not visualized.  Aorta: The aortic root is normal in size and structure.  Venous: The inferior vena cava is normal in size with greater than 50% respiratory variability, suggesting right atrial pressure of 3 mmHg.  IAS/Shunts: No atrial level shunt detected by color flow Doppler.  History of Present Illness     Matthew Stanley is a 58 y.o. male with no recent medical care, + tobacco use and BP check and cholesterol eval at work presented to ER 06/28/19  After initially upper abed pain that was sharp 10 days prior to admit, associated with diaphoresis and N&V.   2 days later developed chest pain.  Pain would radiate to back of both arms.  The pain in abd returned on day of admit.   In ER EKG ST, HR 105, Q waves in multiple leads (?significance), no old available for review.  Troponin hs was 2,662 Cr 0.97 CXR normal.  We saw in ER and admitted with NSTEMI.    Hospital Course     Consultants: none   Troponins continued to rise to pk of 3391 - pt underwent cardiac cath the next day 06/29/19 and + CAD and underwent difficult but successful percutaneous coronary intervention involving the circumflex marginal vessel treated with PTCA, toe thrombectomy, with subsequent proximal marginal dissection successfully treated with ultimate stenting utilizing a 2.25 x 38 mm  Resolute Onyx stent with the stenoses being reduced to 0%.  Pt did well post procedure.  Today with walking in the hall he did have chest/epigastric pain and nausea similar to admit symptoms.  Follow up walk without any pain.  He feels well.  He has been seen and evaluated by Dr. Eldridge Dace and is stable for discharge.   He has been instructed on stopping tobacco.  Cardiac rehab has seen and reviewed meds and activity.   Did the patient have an acute coronary syndrome (MI, NSTEMI, STEMI, etc) this admission?:  Yes                               AHA/ACC Clinical Performance & Quality Measures: 1. Aspirin prescribed? - Yes 2. ADP Receptor Inhibitor (Plavix/Clopidogrel, Brilinta/Ticagrelor or Effient/Prasugrel) prescribed (includes medically managed patients)? - Yes 3. Beta Blocker prescribed? - Yes 4. High Intensity Statin (Lipitor 40-80mg  or Crestor 20-40mg ) prescribed? - Yes 5. EF assessed during THIS hospitalization? - Yes 6. For EF <40%, was ACEI/ARB prescribed? - Not Applicable (EF >/= 40%) 7. For EF <40%, Aldosterone Antagonist (Spironolactone or Eplerenone) prescribed? - Not Applicable (EF >/= 40%) 8. Cardiac Rehab Phase II ordered (Included Medically managed Patients)? - Yes   _____________  Discharge Vitals Blood pressure (!) 161/97, pulse 72, temperature 97.9 F (36.6 C), temperature source Oral, resp. rate 15, height  (1.803 m), weight 82.6 kg,  SpO2 97 %.  Filed Weights   06/28/19 1700 06/28/19 2254 06/30/19 0356  Weight: 83.9 kg 83 kg 82.6 kg    Labs & Radiologic Studies    CBC Recent Labs    06/29/19 0510 06/30/19 0354  WBC 9.1 11.1*  HGB 15.7 14.3  HCT 44.6 40.8  MCV 88.7 88.1  PLT 292 281   Basic Metabolic Panel Recent Labs    16/05/9610/18/20 0510 06/30/19 0354  NA 137 135  K 4.2 4.0  CL 102 105  CO2 24 21*  GLUCOSE 91 101*  BUN 12 16  CREATININE 1.01 0.91  CALCIUM 8.8* 8.2*   Liver Function Tests Recent Labs    06/29/19 0510  AST 32  ALT 30   ALKPHOS 56  BILITOT 1.2  PROT 6.1*  ALBUMIN 3.4*   No results for input(s): LIPASE, AMYLASE in the last 72 hours. High Sensitivity Troponin:   Recent Labs  Lab 06/28/19 1524 06/28/19 1706 06/28/19 2046 06/28/19 2343  TROPONINIHS 2,662* 2,937* 3,100* 3,391*    BNP Invalid input(s): POCBNP D-Dimer No results for input(s): DDIMER in the last 72 hours. Hemoglobin A1C Recent Labs    06/28/19 2046  HGBA1C 5.4   Fasting Lipid Panel Recent Labs    06/30/19 0354  CHOL 135  HDL 37*  LDLCALC 81  TRIG 85  CHOLHDL 3.6   Thyroid Function Tests No results for input(s): TSH, T4TOTAL, T3FREE, THYROIDAB in the last 72 hours.  Invalid input(s): FREET3 _____________  Dg Chest 2 View  Result Date: 06/28/2019 CLINICAL DATA:  Chest and abdominal pain EXAM: CHEST - 2 VIEW COMPARISON:  None. FINDINGS: The heart size and mediastinal contours are within normal limits. Both lungs are clear. The visualized skeletal structures are unremarkable. Calcified hilar nodes suggesting prior granulomatous disease. IMPRESSION: No active cardiopulmonary disease. Electronically Signed   By: Jasmine PangKim  Fujinaga M.D.   On: 06/28/2019 15:30   Disposition   Pt is being discharged home today in good condition.  Follow-up Plans & Appointments  [facility] [address] at [phone] if any bleeding, swelling or drainage at cath site.  May shower, no tub baths for 48 hours for groin sticks. No lifting over 5 pounds for 5 days.  No Driving for 5 days  Take 1 NTG, under your tongue, while sitting.  If no relief of pain may repeat NTG, one tab every 5 minutes up to 3 tablets total over 15 minutes.  If no relief CALL 911.  If you have dizziness/lightheadness  while taking NTG, stop taking and call 911.        Heart Healthy diet  Stop smoking to help your heart  Do not stop asprin and brilinta these keep the stent open stopping could cause a heart attack      Follow-up Information    Corky CraftsVaranasi,  Verley Pariseau S, MD Follow up on 07/15/2019.   Specialties: Cardiology, Radiology, Interventional Cardiology Why: at 2:40 PM   Contact information: [address] [place] [address] [phone]          Discharge Instructions    Amb Referral to Cardiac Rehabilitation   Complete by: As directed    Diagnosis:  NSTEMI Coronary Stents     After initial evaluation and assessments completed: Virtual Based Care may be provided alone or in conjunction with Phase 2 Cardiac Rehab based on patient barriers.: Yes      Discharge Medications   Allergies as of 06/30/2019   No Known Allergies  Medication List    TAKE these medications   acetaminophen 325 MG tablet Commonly known as: TYLENOL Take 2 tablets (650 mg total) by mouth every 4 (four) hours as needed for headache or mild pain.   aspirin 81 MG chewable tablet Chew 1 tablet (81 mg total) by mouth daily. Start taking on: July 01, 2019   atorvastatin 80 MG tablet Commonly known as: LIPITOR Take 1 tablet (80 mg total) by mouth daily at 6 PM.   carvedilol 6.25 MG tablet Commonly known as: COREG Take 1 tablet (6.25 mg total) by mouth 2 (two) times daily with a meal.   nitroGLYCERIN 0.4 MG SL tablet Commonly known as: NITROSTAT Place 1 tablet (0.4 mg total) under the tongue every 5 (five) minutes x 3 doses as needed for chest pain.   ticagrelor 90 MG Tabs tablet Commonly known as: BRILINTA Take 1 tablet (90 mg total) by mouth 2 (two) times daily.          Outstanding Labs/Studies   Hepatic and lipid in 4-6 weeks  BMP on OV  Duration of Discharge Encounter   Greater than 30 minutes including physician time.  Signed, Cecilie Kicks, NP 06/30/2019, 2:15 PM   I have examined the patient and reviewed assessment and plan and discussed with patient.  Agree with above as stated.    NSTEMI s/p PCI of OM.  I personally reviewed the images.  Complex procedure with residual thrombus in at the ostium  of the OM.  He has had more angina with walking, although ECG was ok this AM. Treated with prolonged GP IIbIIIa inhibitor.  Walked without difficulty this afternoon.   DAPT with Brilinta.  HTNnew pt does not see any health care provider.  Coreg started. Will likely need to increase Coreg and consider ACE-I as an outpatient.   Tobacco use  - discussed stopping.  He has discussed stopping with his wife.  He was willing to consider Chantix started pack if cravings returned.  Will need to be discussed at f/u.  HLD Lipids 81 but may be falsely low in the setting of MI. High dose statin.  Larae Grooms

## 2019-06-30 NOTE — Progress Notes (Signed)
TR BAND REMOVAL  LOCATION:    right radial  DEFLATED PER PROTOCOL:    Yes.    TIME BAND OFF / DRESSING APPLIED:    22:30   SITE UPON ARRIVAL:    Level 0  SITE AFTER BAND REMOVAL:    Level 0  CIRCULATION SENSATION AND MOVEMENT:    Within Normal Limits   Yes.    COMMENTS:   Post TR band instructions given. Pt tolerated well. 

## 2019-06-30 NOTE — Progress Notes (Signed)
Progress Note  Patient Name: Corey HaroldRonnie Butt Date of Encounter: 06/30/2019  Primary Cardiologist: Lance MussJayadeep Jaryan Chicoine, MD   Subjective   Had some nausea earlier today.    Inpatient Medications    Scheduled Meds: . aspirin  81 mg Oral Daily  . atorvastatin  80 mg Oral q1800  . carvedilol  6.25 mg Oral BID WC  . sodium chloride flush  3 mL Intravenous Once  . sodium chloride flush  3 mL Intravenous Q12H  . ticagrelor  90 mg Oral BID   Continuous Infusions: . sodium chloride 125 mL/hr at 06/30/19 0500  . sodium chloride    . tirofiban 0.15 mcg/kg/min (06/30/19 [id number])   PRN Meds: sodium chloride, acetaminophen, ALPRAZolam, diazepam, nitroGLYCERIN, ondansetron (ZOFRAN) IV, sodium chloride flush, zolpidem   Vital Signs    Vitals:   06/30/19 0156 06/30/19 0256 06/30/19 0356 06/30/19 0427  BP: (!) 162/92 (!) 163/95 (!) 163/94 (!) 161/97  Pulse: 66 86 73 72  Resp:      Temp:   97.9 F (36.6 C)   TempSrc:   Oral   SpO2: 97% 99% 97% 97%  Weight:   82.6 kg   Height:        Intake/Output Summary (Last 24 hours) at 06/30/2019 0755 Last data filed at 06/30/2019 [id number] Gross per 24 hour  Intake 2487.31 ml  Output 1250 ml  Net 1237.31 ml   Last 3 Weights 06/30/2019 06/28/2019 06/28/2019  Weight (lbs) 182 lb 3.2 oz 182 lb 15.7 oz 185 lb  Weight (kg) 82.645 kg 83 kg 83.915 kg      Telemetry    NSR - Personally Reviewed  ECG    NSR, no ST changes - Personally Reviewed  Physical Exam   GEN: No acute distress.   Neck: No JVD Cardiac: RRR, no murmurs, rubs, or gallops.  Respiratory: Clear to auscultation bilaterally. GI: Soft, nontender, non-distended  MS: No edema; No deformity.2+ right radial pulse Neuro:  Nonfocal  Psych: Normal affect   Labs    High Sensitivity Troponin:   Recent Labs  Lab 06/28/19 1524 06/28/19 1706 06/28/19 2046 06/28/19 2343  TROPONINIHS 2,662* 2,937* 3,100* 3,391*      Chemistry Recent Labs  Lab 06/28/19 1524 06/29/19 0510  06/30/19 0354  NA 135 137 135  K 3.8 4.2 4.0  CL 97* 102 105  CO2 22 24 21*  GLUCOSE 110* 91 101*  BUN 10 12 16   CREATININE 0.97 1.01 0.91  CALCIUM 9.5 8.8* 8.2*  PROT  --  6.1*  --   ALBUMIN  --  3.4*  --   AST  --  32  --   ALT  --  30  --   ALKPHOS  --  56  --   BILITOT  --  1.2  --   GFRNONAA >60 >60 >60  GFRAA >60 >60 >60  ANIONGAP 16* 11 9     Hematology Recent Labs  Lab 06/28/19 1524 06/29/19 0510 06/30/19 0354  WBC 13.2* 9.1 11.1*  RBC 5.50 5.03 4.63  HGB 17.4* 15.7 14.3  HCT 49.2 44.6 40.8  MCV 89.5 88.7 88.1  MCH 31.6 31.2 30.9  MCHC 35.4 35.2 35.0  RDW 12.6 12.6 12.7  PLT 321 292 281    BNPNo results for input(s): BNP, PROBNP in the last 168 hours.   DDimer No results for input(s): DDIMER in the last 168 hours.   Radiology    Dg Chest 2 View  Result Date: 06/28/2019 CLINICAL DATA:  Chest and abdominal pain EXAM: CHEST - 2 VIEW COMPARISON:  None. FINDINGS: The heart size and mediastinal contours are within normal limits. Both lungs are clear. The visualized skeletal structures are unremarkable. Calcified hilar nodes suggesting prior granulomatous disease. IMPRESSION: No active cardiopulmonary disease. Electronically Signed   By: Jasmine Pang M.D.   On: 06/28/2019 15:30    Cardiac Studies    1st Mrg lesion is 95% stenosed.  Mid Cx lesion is 20% stenosed.  Dist Cx lesion is 30% stenosed.  2nd Diag lesion is 80% stenosed.  Mid LAD lesion is 40% stenosed.  Post intervention, there is a 0% residual stenosis.  Prox RCA lesion is 20% stenosed.  A stent was successfully placed.   The LAD has mild irregularity proximally with 40% narrowing.  The second diagonal vessel is very small caliber and had 80% proximal stenosis in the vessel less than 1.5 mm.   Left circumflex vessel is a dominant vessel with subtotal ostial to proximal obtuse marginal stenosis representing the culprit lesion in the patient's ACS/non-STEMI.  There is thrombus in the AV  groove circumflex with 20% narrowing, and 30% mid distal AV groove stenosis.  Small nondominant RCA with mild 20% narrowing in the region of the more marginal marginal takeoff.  Preserved global LV contractility with a subtle region of mid anterolateral hypocontractility.  LVEDP 10 mm.  Difficult but successful percutaneous coronary intervention involving the circumflex marginal vessel treated with PTCA, toe thrombectomy, with subsequent proximal marginal dissection successfully treated with ultimate stenting utilizing a 2.25 x 38 mm Resolute Onyx stent with the stenoses being reduced to 0%.  RECOMMENDATION: DAPT for minimum of 1 year.  Smoking cessation is imperative.  Aggressive lipid-lowering therapy high potency statin therapy and with target LDL less than 80%.   ECHO  06/29/19 IMPRESSIONS    1. Left ventricular ejection fraction, by visual estimation, is 45 to 50%, overall mildly reduced. There is no left ventricular hypertrophy. Basal to mid inferolateral akinesis. Basal anterolateral hypokinesis.  2. Left ventricular diastolic parameters are consistent with Grade I diastolic dysfunction (impaired relaxation).  3. Global right ventricle has normal systolic function.The right ventricular size is normal. No increase in right ventricular wall thickness.  4. Left atrial size was normal.  5. Right atrial size was normal.  6. The mitral valve is normal in structure. Trace mitral valve regurgitation. No evidence of mitral stenosis.  7. The tricuspid valve is normal in structure. Tricuspid valve regurgitation is not demonstrated.  8. The aortic valve is tricuspid. Aortic valve regurgitation is not visualized. Mild aortic valve sclerosis without stenosis.  9. TR signal is inadequate for assessing pulmonary artery systolic pressure. 10. The inferior vena cava is normal in size with greater than 50% respiratory variability, suggesting right atrial pressure of 3 mmHg.  FINDINGS  Left  Ventricle: Left ventricular ejection fraction, by visual estimation, is 45 to 50%. The left ventricle has mildly decreased function. The left ventricular internal cavity size was the left ventricle is normal in size. There is no left ventricular  hypertrophy. Left ventricular diastolic parameters are consistent with Grade I diastolic dysfunction (impaired relaxation).  Right Ventricle: The right ventricular size is normal. No increase in right ventricular wall thickness. Global RV systolic function is has normal systolic function.  Left Atrium: Left atrial size was normal in size.  Right Atrium: Right atrial size was normal in size  Pericardium: There is no evidence of pericardial effusion.  Mitral Valve: The mitral valve is normal in  structure. No evidence of mitral valve stenosis by observation. Trace mitral valve regurgitation.  Tricuspid Valve: The tricuspid valve is normal in structure. Tricuspid valve regurgitation is not demonstrated.  Aortic Valve: The aortic valve is tricuspid. Aortic valve regurgitation is not visualized. Mild aortic valve sclerosis is present, with no evidence of aortic valve stenosis.  Pulmonic Valve: The pulmonic valve was normal in structure. Pulmonic valve regurgitation is not visualized.  Aorta: The aortic root is normal in size and structure.  Venous: The inferior vena cava is normal in size with greater than 50% respiratory variability, suggesting right atrial pressure of 3 mmHg.  IAS/Shunts: No atrial level shunt detected by color flow Doppler.   Patient Profile     58 y.o. male with a history of no recent medical care. Gets yearly cholesterol and BP checks thru his work..  Now with NSTEMI and stent placement.   Assessment & Plan    NSTEMI s/p PCI of OM.  I personally reviewed the images.  Complex procedure with residual thrombus in at the ostium of the OM.  He has had more angina with walking, although ECG was ok this AM. COntinue GP  IIbIIIa inhibitor.  WIll walk him again later.  If sx recur, would plan on relook cath to check patency of OM.  Repeat ECG now.  HTN new pt does not see any health care provider.  Tobacco use  - discussed stopping   HLD  Lipids pending on statin now.    For questions or updates, please contact [place] Please consult [email or web address] for contact info under        Signed, Jettie Booze, MD  06/30/2019, 7:55 AM

## 2019-07-04 ENCOUNTER — Telehealth (HOSPITAL_COMMUNITY): Payer: Self-pay

## 2019-07-04 NOTE — Telephone Encounter (Signed)
Called patient to see if he is interested in the Cardiac Rehab Program. Patient expressed interest in the [facility]CR program ONLY. Explained scheduling process, patient verbalized understanding. Will contact patient for scheduling once f/u has been completed.

## 2019-07-04 NOTE — Telephone Encounter (Signed)
Pt insurance is active and benefits verified through [facility]. Co-pay $0.00, DED $1,000.00/$0.00 met, out of pocket $4,500.00/$40.00 met, co-insurance 20%. No pre-authorization required. Marc/[employer], 07/04/2019 @ 4:10PM, RQS#[id number]  Will contact patient to see if he is interested in the Cardiac Rehab Program. If interested, patient will need to complete follow up appt. Once completed, patient will be contacted for scheduling upon review by the RN Navigator.

## 2019-07-14 NOTE — Progress Notes (Signed)
Cardiology Office Note   Date:  07/15/2019   ID:  Matthew Stanley, DOB [birth year], MRN [id number]  PCP:  Matthew Stanley, [name], MD    No chief complaint on file.  CAD/Old MI  Wt Readings from Last 3 Encounters:  07/15/19 188 lb 1.9 oz (85.3 kg)  06/30/19 182 lb 3.2 oz (82.6 kg)       History of Present Illness: Matthew Stanley is a 58 y.o. male  With CAD.  Cath in 211/20 showed: "Cardiac cath 06/29/19  1st Mrg lesion is 95% stenosed.  Mid Cx lesion is 20% stenosed.  Dist Cx lesion is 30% stenosed.  2nd Diag lesion is 80% stenosed.  Mid LAD lesion is 40% stenosed.  Post intervention, there is a 0% residual stenosis.  Prox RCA lesion is 20% stenosed.  A stent was successfully placed.  The LAD has mild irregularity proximally with 40% narrowing. The second diagonal vessel is very small caliber and had 80% proximal stenosis in the vessel less than 1.5 mm.   Left circumflex vessel is a dominant vessel with subtotal ostial to proximal obtuse marginal stenosis representing the culprit lesion in the patient's ACS/non-STEMI. There is thrombus in the AV groove circumflex with 20% narrowing, and 30% mid distal AV groove stenosis.  Small nondominant RCA with mild 20% narrowing in the region of the more marginal marginal takeoff.  Preserved global LV contractility with a subtle region of mid anterolateral hypocontractility. LVEDP 10 mm.  Difficult but successful percutaneous coronary intervention involving the circumflex marginal vessel treated with PTCA, toe thrombectomy, with subsequent proximal marginal dissection successfully treated with ultimate stenting utilizing a 2.25 x 38 mm Resolute Onyx stent with the stenoses being reduced to 0%.  RECOMMENDATION: DAPT for minimum of 1 year. Smoking cessation is imperative. Aggressive lipid-lowering therapy high potency statin therapy and with target LDL less than 80%. "  Since leaving the hospital, he has done well.   Energy level is improving.  He stopped smoking.  He is using an exercise.    Denies : Chest pain. Dizziness. Leg edema. Nitroglycerin use. Orthopnea. Palpitations. Paroxysmal nocturnal dyspnea. Shortness of breath. Syncope.   No bleeding problems. Mild bruise at the access site for cath.     Past Medical History:  Diagnosis Date  . CAD in native artery 06/30/2019  . HLD (hyperlipidemia) 06/30/2019  . HTN (hypertension) - new dx 06/28/2019  . NSTEMI (non-ST elevated myocardial infarction) (HCC) 06/28/2019  . S/P angioplasty with stent 06/29/19 DES to LCX OM  06/30/2019  . Shingles 2019  . Tobacco use 06/28/2019    Past Surgical History:  Procedure Laterality Date  . CORONARY STENT INTERVENTION N/A 06/29/2019   Procedure: CORONARY STENT INTERVENTION;  Surgeon: Lennette Bihari, MD;  Location: [facility];  Service: Cardiovascular;  Laterality: N/A;  . CORONARY THROMBECTOMY N/A 06/29/2019   Procedure: Coronary Thrombectomy;  Surgeon: Lennette Bihari, MD;  Location: [facility];  Service: Cardiovascular;  Laterality: N/A;  . LEFT HEART CATH AND CORONARY ANGIOGRAPHY N/A 06/29/2019   Procedure: LEFT HEART CATH AND CORONARY ANGIOGRAPHY;  Surgeon: Lennette Bihari, MD;  Location: [facility];  Service: Cardiovascular;  Laterality: N/A;  . None       Current Outpatient Medications  Medication Sig Dispense Refill  . acetaminophen (TYLENOL) 325 MG tablet Take 2 tablets (650 mg total) by mouth every 4 (four) hours as needed for headache or mild pain.    Marland Kitchen aspirin 81 MG  chewable tablet Chew 1 tablet (81 mg total) by mouth daily.    Marland Kitchen atorvastatin (LIPITOR) 80 MG tablet Take 1 tablet (80 mg total) by mouth daily at 6 PM. 30 tablet 6  . carvedilol (COREG) 6.25 MG tablet Take 1 tablet (6.25 mg total) by mouth 2 (two) times daily with a meal. 60 tablet 6  . nitroGLYCERIN (NITROSTAT) 0.4 MG SL tablet Place 1 tablet (0.4 mg total) under the tongue every 5 (five) minutes x 3  doses as needed for chest pain. 25 tablet 4  . ticagrelor (BRILINTA) 90 MG TABS tablet Take 1 tablet (90 mg total) by mouth 2 (two) times daily. 180 tablet 3   No current facility-administered medications for this visit.     Allergies:   Patient has no known allergies.    Social History:  The patient  reports that he has been smoking cigarettes. He has a 40.00 pack-year smoking history. He has never used smokeless tobacco. He reports current alcohol use of about 1.0 standard drinks of alcohol per week. He reports that he does not use drugs.   Family History:  The patient's family history includes Heart Problems in his father; Stroke in his mother.    ROS:  Please see the history of present illness.   Otherwise, review of systems are positive for mild bruising.   All other systems are reviewed and negative.    PHYSICAL EXAM: VS:  BP 130/80   Pulse 80   Ht 5\' 11"  (1.803 m)   Wt 188 lb 1.9 oz (85.3 kg)   SpO2 98%   BMI 26.24 kg/m  , BMI Body mass index is 26.24 kg/m. GEN: Well nourished, well developed, in no acute distress  HEENT: normal  Neck: no JVD, carotid bruits, or masses Cardiac: RRR; no murmurs, rubs, or gallops,no edema  Respiratory:  clear to auscultation bilaterally, normal work of breathing GI: soft, nontender, nondistended, + BS MS: no deformity or atrophy , mild right forearm bruising, 2+ right radial pulse Skin: warm and dry, no rash Neuro:  Strength and sensation are intact Psych: euthymic mood, full affect    Recent Labs: 06/29/2019: ALT 30 06/30/2019: BUN 16; Creatinine, Ser 0.91; Hemoglobin 14.3; Platelets 281; Potassium 4.0; Sodium 135   Lipid Panel    Component Value Date/Time   CHOL 135 06/30/2019 0354   TRIG 85 06/30/2019 0354   HDL 37 (L) 06/30/2019 0354   CHOLHDL 3.6 06/30/2019 0354   VLDL 17 06/30/2019 0354   LDLCALC 81 06/30/2019 0354     Other studies Reviewed: Additional studies/ records that were reviewed today with results  demonstrating: hospital records reviewed.   ASSESSMENT AND PLAN:  1. CAD: No angina.  EF 45-50%. No CHF sx.  Continue aggressive secondary prevention.  Diet has improved.  We spoke about high fiber, plant based diet. 2. HTN: The current medical regimen is effective;  continue present plan and medications.  Consider ACE-I if his BP increases. 3. Tobacco abuse: He has stopped smoking.  Wife is also cutting back.  I congratulated him on this.   4. Hyperlipidemia: LDL 81 in 06/2019.   5. He is going to do virtual cardiac rehab.  He will continue to exercise at home.    Current medicines are reviewed at length with the patient today.  The patient concerns regarding his medicines were addressed.  The following changes have been made:  No change  Labs/ tests ordered today include:  No orders of the defined types were  placed in this encounter.   Recommend 150 minutes/week of aerobic exercise Low fat, low carb, high fiber diet recommended  Disposition:   FU in 4 months   Signed, Lance MussJayadeep , MD  07/15/2019 3:02 PM    [facility] [address], [place], [place]  [address] Phone: [phone]; Fax: [phone]

## 2019-07-15 ENCOUNTER — Ambulatory Visit: Payer: Commercial Managed Care - PPO | Admitting: Interventional Cardiology

## 2019-07-15 ENCOUNTER — Encounter: Payer: Self-pay | Admitting: Interventional Cardiology

## 2019-07-15 ENCOUNTER — Other Ambulatory Visit: Payer: Self-pay

## 2019-07-15 VITALS — BP 130/80 | HR 80 | Ht 71.0 in | Wt 188.1 lb

## 2019-07-15 DIAGNOSIS — Z720 Tobacco use: Secondary | ICD-10-CM

## 2019-07-15 DIAGNOSIS — E782 Mixed hyperlipidemia: Secondary | ICD-10-CM | POA: Diagnosis not present

## 2019-07-15 DIAGNOSIS — Z23 Encounter for immunization: Secondary | ICD-10-CM

## 2019-07-15 DIAGNOSIS — I25118 Atherosclerotic heart disease of native coronary artery with other forms of angina pectoris: Secondary | ICD-10-CM | POA: Diagnosis not present

## 2019-07-15 DIAGNOSIS — I10 Essential (primary) hypertension: Secondary | ICD-10-CM

## 2019-07-15 NOTE — Patient Instructions (Signed)
Medication Instructions:  Your physician recommends that you continue on your current medications as directed. Please refer to the Current Medication list given to you today.  *If you need a refill on your cardiac medications before your next appointment, please call your pharmacy*  Lab Work: None ordered  If you have labs (blood work) drawn today and your tests are completely normal, you will receive your results only by: Marland Kitchen MyChart Message (if you have MyChart) OR . A paper copy in the mail If you have any lab test that is abnormal or we need to change your treatment, we will call you to review the results.  Testing/Procedures: None ordered  Follow-Up: At [facility], you and your health needs are our priority.  As part of our continuing mission to provide you with exceptional heart care, we have created designated Provider Care Teams.  These Care Teams include your primary Cardiologist (physician) and Advanced Practice Providers (APPs -  Physician Assistants and Nurse Practitioners) who all work together to provide you with the care you need, when you need it.  Your next appointment:   4 month(s)  The format for your next appointment:   In Person  Provider:   You may see Larae Grooms, MD or one of the following Advanced Practice Providers on your designated Care Team:    Melina Copa, PA-C  Ermalinda Barrios, PA-C   Other Instructions

## 2019-07-22 ENCOUNTER — Encounter (HOSPITAL_COMMUNITY)
Admission: RE | Admit: 2019-07-22 | Discharge: 2019-07-22 | Disposition: A | Discharge status: Home / Self Care | Payer: Self-pay | Source: Ambulatory Visit | Attending: Interventional Cardiology | Admitting: Interventional Cardiology

## 2019-07-22 ENCOUNTER — Other Ambulatory Visit: Payer: Self-pay

## 2019-07-22 ENCOUNTER — Telehealth (HOSPITAL_COMMUNITY): Payer: Self-pay

## 2019-07-22 NOTE — Telephone Encounter (Signed)

## 2019-07-22 NOTE — Telephone Encounter (Signed)
Pt is interested in participating in Virtual Cardiac and Pulmonary Rehab. Pt advised that Virtual Cardiac and Pulmonary Rehab is provided at no cost to the patient.  Checklist:  1. Pt has smart device  ie smartphone and/or ipad for downloading an app  Yes 2. Reliable internet/wifi service    Yes 3. Understands how to use their smartphone and navigate within an app.  Yes   Pt verbalized understanding and is in agreement.  

## 2019-07-22 NOTE — Telephone Encounter (Signed)
Pass pt card to [facility].

## 2019-08-17 ENCOUNTER — Other Ambulatory Visit: Payer: Self-pay

## 2019-08-17 ENCOUNTER — Encounter: Payer: Self-pay | Admitting: Internal Medicine

## 2019-08-17 ENCOUNTER — Ambulatory Visit: Payer: Managed Care, Other (non HMO) | Admitting: Internal Medicine

## 2019-08-17 VITALS — BP 190/110 | HR 78 | Temp 97.1°F | Ht 71.0 in | Wt 196.8 lb

## 2019-08-17 DIAGNOSIS — Z95820 Peripheral vascular angioplasty status with implants and grafts: Secondary | ICD-10-CM

## 2019-08-17 DIAGNOSIS — I10 Essential (primary) hypertension: Secondary | ICD-10-CM | POA: Diagnosis not present

## 2019-08-17 DIAGNOSIS — Z720 Tobacco use: Secondary | ICD-10-CM

## 2019-08-17 DIAGNOSIS — I214 Non-ST elevation (NSTEMI) myocardial infarction: Secondary | ICD-10-CM | POA: Diagnosis not present

## 2019-08-17 DIAGNOSIS — I2510 Atherosclerotic heart disease of native coronary artery without angina pectoris: Secondary | ICD-10-CM | POA: Diagnosis not present

## 2019-08-17 DIAGNOSIS — Z23 Encounter for immunization: Secondary | ICD-10-CM | POA: Diagnosis not present

## 2019-08-17 DIAGNOSIS — E782 Mixed hyperlipidemia: Secondary | ICD-10-CM

## 2019-08-17 MED ORDER — CARVEDILOL 12.5 MG PO TABS: 12.5000 mg | ORAL_TABLET | Freq: Two times a day (BID) | ORAL | 1 refills | Status: DC

## 2019-08-17 MED ORDER — ASPIRIN EC 81 MG PO TBEC: 81.0000 mg | DELAYED_RELEASE_TABLET | Freq: Every day | ORAL | 3 refills | Status: DC

## 2019-08-17 NOTE — Addendum Note (Signed)
Addended by: Kern Reap B on: 08/17/2019 04:03 PM   Modules accepted: Orders

## 2019-08-17 NOTE — Progress Notes (Signed)
New Patient Office Visit     This visit occurred during the SARS-CoV-2 public health emergency.  Safety protocols were in place, including screening questions prior to the visit, additional usage of staff PPE, and extensive cleaning of exam room while observing appropriate contact time as indicated for disinfecting solutions.    CC/Reason for Visit: Establish care, discuss chronic medical conditions Previous PCP: None Last Visit: Unknown  HPI: Matthew Stanley is a 59 y.o. male who is coming in today for the above mentioned reasons. Past Medical History is significant for: Coronary artery disease with recent non-ST elevated MI in November 2020, history of hypertension, hyperlipidemia and tobacco abuse for which he quit recently after his MI.  His family history is significant for 2 brothers who are deceased from alpha-1 antitrypsin deficiency.  He was tested and knows he is a carrier.  He saw his cardiologist on December 4 and was asked to call after the first of the year for follow-up appointment which he has not done yet.  After reviewing medications, he has not been taking aspirin.  Cardiology recommends dual antiplatelet therapy for 1 year.  He has not had to use nitroglycerin since his hospital discharge.  He has been doing virtual cardiac rehab, he bought a treadmill and has been walking outside every day.  He is requesting Tdap and shingles vaccination today.  He has no acute complaints.   Past Medical/Surgical History: Past Medical History:  Diagnosis Date  . CAD in native artery 06/30/2019  . HLD (hyperlipidemia) 06/30/2019  . HTN (hypertension) - new dx 06/28/2019  . NSTEMI (non-ST elevated myocardial infarction) (HCC) 06/28/2019  . S/P angioplasty with stent 06/29/19 DES to LCX OM  06/30/2019  . Shingles 2019  . Tobacco use 06/28/2019    Past Surgical History:  Procedure Laterality Date  . CORONARY STENT INTERVENTION N/A 06/29/2019   Procedure: CORONARY STENT INTERVENTION;   Surgeon: Lennette Bihari, MD;  Location: [facility];  Service: Cardiovascular;  Laterality: N/A;  . CORONARY THROMBECTOMY N/A 06/29/2019   Procedure: Coronary Thrombectomy;  Surgeon: Lennette Bihari, MD;  Location: [facility];  Service: Cardiovascular;  Laterality: N/A;  . LEFT HEART CATH AND CORONARY ANGIOGRAPHY N/A 06/29/2019   Procedure: LEFT HEART CATH AND CORONARY ANGIOGRAPHY;  Surgeon: Lennette Bihari, MD;  Location: [facility];  Service: Cardiovascular;  Laterality: N/A;  . None      Social History:  reports that he quit smoking about 7 weeks ago. His smoking use included cigarettes. He has a 40.00 pack-year smoking history. He has never used smokeless tobacco. He reports current alcohol use of about 1.0 standard drinks of alcohol per week. He reports that he does not use drugs.  Allergies: No Known Allergies  Family History:  Family History  Problem Relation Age of Onset  . Stroke Mother        died in his 80s  . Heart Problems Father        Died in her 9s     Current Outpatient Medications:  .  atorvastatin (LIPITOR) 80 MG tablet, Take 1 tablet (80 mg total) by mouth daily at 6 PM., Disp: 30 tablet, Rfl: 6 .  carvedilol (COREG) 12.5 MG tablet, Take 1 tablet (12.5 mg total) by mouth 2 (two) times daily with a meal., Disp: 180 tablet, Rfl: 1 .  nitroGLYCERIN (NITROSTAT) 0.4 MG SL tablet, Place 1 tablet (0.4 mg total) under the tongue every 5 (five) minutes x  3 doses as needed for chest pain., Disp: 25 tablet, Rfl: 4 .  ticagrelor (BRILINTA) 90 MG TABS tablet, Take 1 tablet (90 mg total) by mouth 2 (two) times daily., Disp: 180 tablet, Rfl: 3 .  aspirin EC 81 MG tablet, Take 1 tablet (81 mg total) by mouth daily., Disp: 90 tablet, Rfl: 3  Review of Systems:  Constitutional: Denies fever, chills, diaphoresis, appetite change and fatigue.  HEENT: Denies photophobia, eye pain, redness, hearing loss, ear pain, congestion, sore throat, rhinorrhea, sneezing,  mouth sores, trouble swallowing, neck pain, neck stiffness and tinnitus.   Respiratory: Denies SOB, DOE, cough, chest tightness,  and wheezing.   Cardiovascular: Denies chest pain, palpitations and leg swelling.  Gastrointestinal: Denies nausea, vomiting, abdominal pain, diarrhea, constipation, blood in stool and abdominal distention.  Genitourinary: Denies dysuria, urgency, frequency, hematuria, flank pain and difficulty urinating.  Endocrine: Denies: hot or cold intolerance, sweats, changes in hair or nails, polyuria, polydipsia. Musculoskeletal: Denies myalgias, back pain, joint swelling, arthralgias and gait problem.  Skin: Denies pallor, rash and wound.  Neurological: Denies dizziness, seizures, syncope, weakness, light-headedness, numbness and headaches.  Hematological: Denies adenopathy. Easy bruising, personal or family bleeding history  Psychiatric/Behavioral: Denies suicidal ideation, mood changes, confusion, nervousness, sleep disturbance and agitation    Physical Exam: Vitals:   08/17/19 0851 08/17/19 0922  BP: (!) 170/100 (!) 190/110  Pulse: 78   Temp: (!) 97.1 F (36.2 C)   TempSrc: Temporal   SpO2: 98%   Weight: 196 lb 12.8 oz (89.3 kg)   Height: 5\' 11"  (1.803 m)    Body mass index is 27.45 kg/m.  Constitutional: NAD, calm, comfortable Eyes: PERRL, lids and conjunctivae normal ENMT: Mucous membranes are moist.  Respiratory: clear to auscultation bilaterally, no wheezing, no crackles. Normal respiratory effort. No accessory muscle use.  Cardiovascular: Regular rate and rhythm, no murmurs / rubs / gallops. No extremity edema. Neurologic: Grossly intact and nonfocal Psychiatric: Normal judgment and insight. Alert and oriented x 3. Normal mood.    Impression and Plan:  CAD in native artery  NSTEMI (non-ST elevated myocardial infarction) ([place]) S/P angioplasty with stent 06/29/19 DES to LCX OM  -Stable, no chest pain. -Continue statin, carvedilol.  He is supposed  to be on dual antiplatelet therapy: He is taking Brilinta but not aspirin, he will start taking immediately. -With ejection fraction of 45%, consider ACE inhibitor/ARB as next step. -He will call cardiology to schedule follow-up.  Essential hypertension  -Blood pressure remains uncontrolled, 2 measurements in office today 170/100 and 190/110. -Increase carvedilol from 6.25 twice daily to 12.5 twice daily. -He has been asked to schedule follow-up with cardiology, if he is unable to get in with them before 3 months have passed, then he will schedule follow-up with me in 6 weeks for blood pressure recheck. -Consider adding ACE inhibitor/ARB as next step.  Mixed hyperlipidemia -LDL was 81 in November 2020, goal less than 70. -Continue atorvastatin 80 mg.  Tobacco use -Quit after MI in November, he has been congratulated on his success.     Patient Instructions  -Nice seeing you today!!  -START ASA 81 mg daily.  -Increase carvedilol to 12.5 mg twice daily. For now take 2 tabs twice a day until you run out. New prescription will be for the full 12.5 mg and you will take 1 tablet twice a day.  -Schedule follow up with your heart doctor.  -Schedule follow up with me in 3 months for your physical. If you are unable  to see your heart doctor within 3 months, schedule follow up with me in 6 weeks also for a blood pressure check.  -Tetanus and first shingles vaccine today.     Chaya Jan, MD [facility]

## 2019-08-17 NOTE — Patient Instructions (Signed)
-  Nice seeing you today!!  -START ASA 81 mg daily.  -Increase carvedilol to 12.5 mg twice daily. For now take 2 tabs twice a day until you run out. New prescription will be for the full 12.5 mg and you will take 1 tablet twice a day.  -Schedule follow up with your heart doctor.  -Schedule follow up with me in 3 months for your physical. If you are unable to see your heart doctor within 3 months, schedule follow up with me in 6 weeks also for a blood pressure check.  -Tetanus and first shingles vaccine today.

## 2019-08-19 ENCOUNTER — Encounter (HOSPITAL_COMMUNITY)
Admission: RE | Admit: 2019-08-19 | Discharge: 2019-08-19 | Disposition: A | Discharge status: Home / Self Care | Payer: Self-pay | Source: Ambulatory Visit | Attending: Interventional Cardiology | Admitting: Interventional Cardiology

## 2019-08-19 NOTE — Progress Notes (Signed)
Patient is not active in the [employer] app. Spoke with patient today, and patient states that while he is not good at logging his exercise, he is exercising daily. Patient is taking a brisk walk about 1 mile and/or riding his stationary bike about 2.5 miles daily. Patient is checking his pulse, which he states stays in the 80s with exercise, and he does not have any questions or concerns about his exercise routine at this time. Will discharge from the virtual cardiac rehab program. Patient's virtual cardiac rehab report will be scanned to media for review.  Matthew Pais, MS, ACSM [employer] 08/19/2019 [phone]

## 2019-10-30 NOTE — Progress Notes (Signed)
Cardiology Office Note   Date:  11/01/2019   ID:  Matthew Stanley, Matthew Stanley Oct 03, 1960, MRN [id number]  PCP:  Philip Aspen, [name], MD    No chief complaint on file.  CAD  Wt Readings from Last 3 Encounters:  11/01/19 205 lb (93 kg)  08/17/19 196 lb 12.8 oz (89.3 kg)  07/15/19 188 lb 1.9 oz (85.3 kg)       History of Present Illness: Matthew Stanley is a 59 y.o. male   With CAD.  Cath in 11/20 showed: "Cardiac cath 06/29/19  1st Mrg lesion is 95% stenosed.  Mid Cx lesion is 20% stenosed.  Dist Cx lesion is 30% stenosed.  2nd Diag lesion is 80% stenosed.  Mid LAD lesion is 40% stenosed.  Post intervention, there is a 0% residual stenosis.  Prox RCA lesion is 20% stenosed.  A stent was successfully placed.  The LAD has mild irregularity proximally with 40% narrowing. The second diagonal vessel is very small caliber and had 80% proximal stenosis in the vessel less than 1.5 mm.   Left circumflex vessel is a dominant vessel with subtotal ostial to proximal obtuse marginal stenosis representing the culprit lesion in the patient's ACS/non-STEMI. There is thrombus in the AV groove circumflex with 20% narrowing, and 30% mid distal AV groove stenosis.  Small nondominant RCA with mild 20% narrowing in the region of the more marginal marginal takeoff.  Preserved global LV contractility with a subtle region of mid anterolateral hypocontractility. LVEDP 10 mm.  Difficult but successful percutaneous coronary intervention involving the circumflex marginal vessel treated with PTCA, toe thrombectomy, with subsequent proximal marginal dissection successfully treated with ultimate stenting utilizing a 2.25 x 38 mm Resolute Onyx stent with the stenoses being reduced to 0%.  RECOMMENDATION: DAPT for minimum of 1 year. Smoking cessation is imperative. Aggressive lipid-lowering therapy high potency statin therapy and with target LDL less than 80%. "  Since leaving  the hospital, he has done well.  Energy level is improving.  He stopped smoking.  He is using an exercise machine.    He continues to walk regularly.  He uses a stationary bike as well.  He has improved his diet.  He has quit s,oking and his wife has quit as well.    BP has been an issue.  He is trying to eat a strict diet.  BP 125/74 yesterday.  Usually in the 120s systolic.  Highest in the 130s.  BP has been controlled at home but higher in the MDs office.    Past Medical History:  Diagnosis Date  . CAD in native artery 06/30/2019  . HLD (hyperlipidemia) 06/30/2019  . HTN (hypertension) - new dx 06/28/2019  . NSTEMI (non-ST elevated myocardial infarction) (HCC) 06/28/2019  . S/P angioplasty with stent 06/29/19 DES to LCX OM  06/30/2019  . Shingles 2019  . Tobacco use 06/28/2019    Past Surgical History:  Procedure Laterality Date  . CORONARY STENT INTERVENTION N/A 06/29/2019   Procedure: CORONARY STENT INTERVENTION;  Surgeon: Lennette Bihari, MD;  Location: [facility];  Service: Cardiovascular;  Laterality: N/A;  . CORONARY THROMBECTOMY N/A 06/29/2019   Procedure: Coronary Thrombectomy;  Surgeon: Lennette Bihari, MD;  Location: [facility];  Service: Cardiovascular;  Laterality: N/A;  . LEFT HEART CATH AND CORONARY ANGIOGRAPHY N/A 06/29/2019   Procedure: LEFT HEART CATH AND CORONARY ANGIOGRAPHY;  Surgeon: Lennette Bihari, MD;  Location: [facility];  Service: Cardiovascular;  Laterality: N/A;  . None       Current Outpatient Medications  Medication Sig Dispense Refill  . aspirin EC 81 MG tablet Take 1 tablet (81 mg total) by mouth daily. 90 tablet 3  . atorvastatin (LIPITOR) 80 MG tablet Take 1 tablet (80 mg total) by mouth daily at 6 PM. 30 tablet 6  . carvedilol (COREG) 12.5 MG tablet Take 1 tablet (12.5 mg total) by mouth 2 (two) times daily with a meal. 180 tablet 1  . nitroGLYCERIN (NITROSTAT) 0.4 MG SL tablet Place 1 tablet (0.4 mg total) under the  tongue every 5 (five) minutes x 3 doses as needed for chest pain. 25 tablet 4  . ticagrelor (BRILINTA) 90 MG TABS tablet Take 1 tablet (90 mg total) by mouth 2 (two) times daily. 180 tablet 3   No current facility-administered medications for this visit.    Allergies:   Patient has no known allergies.    Social History:  The patient  reports that he quit smoking about 4 months ago. His smoking use included cigarettes. He has a 40.00 pack-year smoking history. He has never used smokeless tobacco. He reports current alcohol use of about 1.0 standard drinks of alcohol per week. He reports that he does not use drugs.   Family History:  The patient's family history includes Heart Problems in his father; Stroke in his mother.    ROS:  Please see the history of present illness.   Otherwise, review of systems are positive for improving lifestyle.   All other systems are reviewed and negative.    PHYSICAL EXAM: VS:  BP (!) 156/102   Pulse 67   Ht 5\' 11"  (1.803 m)   Wt 205 lb (93 kg)   SpO2 98%   BMI 28.59 kg/m  , BMI Body mass index is 28.59 kg/m. GEN: Well nourished, well developed, in no acute distress  HEENT: normal  Neck: no JVD, carotid bruits, or masses Cardiac: RRR; no murmurs, rubs, or gallops,no edema  Respiratory:  clear to auscultation bilaterally, normal work of breathing GI: soft, nontender, nondistended, + BS MS: no deformity or atrophy  Skin: warm and dry, no rash Neuro:  Strength and sensation are intact Psych: euthymic mood, full affect   EKG:   The ekg ordered 11/20 demonstrates NSR, no ST changes   Recent Labs: 06/29/2019: ALT 30 06/30/2019: BUN 16; Creatinine, Ser 0.91; Hemoglobin 14.3; Platelets 281; Potassium 4.0; Sodium 135   Lipid Panel    Component Value Date/Time   CHOL 135 06/30/2019 0354   TRIG 85 06/30/2019 0354   HDL 37 (L) 06/30/2019 0354   CHOLHDL 3.6 06/30/2019 0354   VLDL 17 06/30/2019 0354   LDLCALC 81 06/30/2019 0354     Other  studies Reviewed: Additional studies/ records that were reviewed today with results demonstrating: hospital records reviewed; LDL 81 in 06/2019.   ASSESSMENT AND PLAN:  1. CAD/old MI: Ejection fraction was 45 to 50% at the time of the MI.   2. Hypertension: controlled at home. Continue current meds.  Check home BP machine in the office some time in the future. 3. Tobacco abuse: He stopped smoking at the time of his MI.  His wife has also quit.   4. Hyperlipidemia: Continue high-dose statin.  Check lipids at physical in 11/2019 with PMD.   Current medicines are reviewed at length with the patient today.  The patient concerns regarding his medicines were addressed.  The following changes have been made:  No  change  Labs/ tests ordered today include:  No orders of the defined types were placed in this encounter.   Recommend 150 minutes/week of aerobic exercise Low fat, low carb, high fiber diet recommended  Disposition:   FU in 6 months   Signed, Lance Muss, MD  11/01/2019 10:38 AM    [facility] [address] [place], [place], [place]  [address] Phone: [phone]; Fax: [phone]

## 2019-11-01 ENCOUNTER — Other Ambulatory Visit: Payer: Self-pay

## 2019-11-01 ENCOUNTER — Encounter: Payer: Self-pay | Admitting: Interventional Cardiology

## 2019-11-01 ENCOUNTER — Ambulatory Visit: Payer: Managed Care, Other (non HMO) | Admitting: Interventional Cardiology

## 2019-11-01 VITALS — BP 156/102 | HR 67 | Ht 71.0 in | Wt 205.0 lb

## 2019-11-01 DIAGNOSIS — Z720 Tobacco use: Secondary | ICD-10-CM | POA: Diagnosis not present

## 2019-11-01 DIAGNOSIS — E782 Mixed hyperlipidemia: Secondary | ICD-10-CM

## 2019-11-01 DIAGNOSIS — I10 Essential (primary) hypertension: Secondary | ICD-10-CM

## 2019-11-01 DIAGNOSIS — I2510 Atherosclerotic heart disease of native coronary artery without angina pectoris: Secondary | ICD-10-CM

## 2019-11-01 DIAGNOSIS — I25118 Atherosclerotic heart disease of native coronary artery with other forms of angina pectoris: Secondary | ICD-10-CM | POA: Diagnosis not present

## 2019-11-01 MED ORDER — CARVEDILOL 12.5 MG PO TABS: 12.5000 mg | ORAL_TABLET | Freq: Two times a day (BID) | ORAL | 3 refills | Status: DC

## 2019-11-01 MED ORDER — TICAGRELOR 90 MG PO TABS: 90.0000 mg | ORAL_TABLET | Freq: Two times a day (BID) | ORAL | 3 refills | Status: DC

## 2019-11-01 MED ORDER — ATORVASTATIN CALCIUM 80 MG PO TABS: 80.0000 mg | ORAL_TABLET | Freq: Every day | ORAL | 3 refills | Status: DC

## 2019-11-01 MED ORDER — NITROGLYCERIN 0.4 MG SL SUBL: 0.4000 mg | SUBLINGUAL_TABLET | SUBLINGUAL | 4 refills | Status: DC | PRN

## 2019-11-01 NOTE — Patient Instructions (Signed)
Medication Instructions:  Your physician recommends that you continue on your current medications as directed. Please refer to the Current Medication list given to you today.   *If you need a refill on your cardiac medications before your next appointment, please call your pharmacy*   Lab Work: None ordered  If you have labs (blood work) drawn today and your tests are completely normal, you will receive your results only by: Marland Kitchen MyChart Message (if you have MyChart) OR . A paper copy in the mail If you have any lab test that is abnormal or we need to change your treatment, we will call you to review the results.   Testing/Procedures: None ordered   Follow-Up: At [facility], you and your health needs are our priority.  As part of our continuing mission to provide you with exceptional heart care, we have created designated Provider Care Teams.  These Care Teams include your primary Cardiologist (physician) and Advanced Practice Providers (APPs -  Physician Assistants and Nurse Practitioners) who all work together to provide you with the care you need, when you need it.  We recommend signing up for the patient portal called "MyChart".  Sign up information is provided on this After Visit Summary.  MyChart is used to connect with patients for Virtual Visits (Telemedicine).  Patients are able to view lab/test results, encounter notes, upcoming appointments, etc.  Non-urgent messages can be sent to your provider as well.   To learn more about what you can do with MyChart, go to [email or web address].    Your next appointment:   8 month(s)  The format for your next appointment:   In Person  Provider:   You may see Lance Muss, MD or one of the following Advanced Practice Providers on your designated Care Team:    Ronie Spies, PA-C  Jacolyn Reedy, PA-C    Other Instructions

## 2019-11-15 ENCOUNTER — Other Ambulatory Visit: Payer: Self-pay

## 2019-11-16 ENCOUNTER — Ambulatory Visit (INDEPENDENT_AMBULATORY_CARE_PROVIDER_SITE_OTHER): Payer: Managed Care, Other (non HMO) | Admitting: Internal Medicine

## 2019-11-16 ENCOUNTER — Encounter: Payer: Self-pay | Admitting: Internal Medicine

## 2019-11-16 VITALS — BP 140/80 | HR 66 | Temp 97.1°F | Ht 70.5 in | Wt 203.5 lb

## 2019-11-16 DIAGNOSIS — Z1211 Encounter for screening for malignant neoplasm of colon: Secondary | ICD-10-CM

## 2019-11-16 DIAGNOSIS — Z0000 Encounter for general adult medical examination without abnormal findings: Secondary | ICD-10-CM | POA: Diagnosis not present

## 2019-11-16 DIAGNOSIS — E782 Mixed hyperlipidemia: Secondary | ICD-10-CM | POA: Diagnosis not present

## 2019-11-16 DIAGNOSIS — Z23 Encounter for immunization: Secondary | ICD-10-CM

## 2019-11-16 DIAGNOSIS — Z125 Encounter for screening for malignant neoplasm of prostate: Secondary | ICD-10-CM | POA: Diagnosis not present

## 2019-11-16 DIAGNOSIS — I214 Non-ST elevation (NSTEMI) myocardial infarction: Secondary | ICD-10-CM

## 2019-11-16 DIAGNOSIS — Z720 Tobacco use: Secondary | ICD-10-CM

## 2019-11-16 DIAGNOSIS — I10 Essential (primary) hypertension: Secondary | ICD-10-CM

## 2019-11-16 DIAGNOSIS — I2510 Atherosclerotic heart disease of native coronary artery without angina pectoris: Secondary | ICD-10-CM

## 2019-11-16 LAB — VITAMIN B12: Vitamin B-12: 385 pg/mL (ref 211–911)

## 2019-11-16 LAB — HEMOGLOBIN A1C: Hgb A1c MFr Bld: 5.9 % (ref 4.6–6.5)

## 2019-11-16 LAB — CBC WITH DIFFERENTIAL/PLATELET
Basophils Absolute: 0 10*3/uL (ref 0.0–0.1)
Basophils Relative: 0.6 % (ref 0.0–3.0)
Eosinophils Absolute: 0.4 10*3/uL (ref 0.0–0.7)
Eosinophils Relative: 5.4 % — ABNORMAL HIGH (ref 0.0–5.0)
HCT: 41.7 % (ref 39.0–52.0)
Hemoglobin: 14.3 g/dL (ref 13.0–17.0)
Lymphocytes Relative: 30.9 % (ref 12.0–46.0)
Lymphs Abs: 2.2 10*3/uL (ref 0.7–4.0)
MCHC: 34.4 g/dL (ref 30.0–36.0)
MCV: 89.3 fl (ref 78.0–100.0)
Monocytes Absolute: 0.5 10*3/uL (ref 0.1–1.0)
Monocytes Relative: 7.4 % (ref 3.0–12.0)
Neutro Abs: 3.9 10*3/uL (ref 1.4–7.7)
Neutrophils Relative %: 55.7 % (ref 43.0–77.0)
Platelets: 256 10*3/uL (ref 150.0–400.0)
RBC: 4.67 Mil/uL (ref 4.22–5.81)
RDW: 13.3 % (ref 11.5–15.5)
WBC: 7 10*3/uL (ref 4.0–10.5)

## 2019-11-16 LAB — PSA: PSA: 0.44 ng/mL (ref 0.10–4.00)

## 2019-11-16 LAB — COMPREHENSIVE METABOLIC PANEL
ALT: 34 U/L (ref 0–53)
AST: 26 U/L (ref 0–37)
Albumin: 4.2 g/dL (ref 3.5–5.2)
Alkaline Phosphatase: 74 U/L (ref 39–117)
BUN: 13 mg/dL (ref 6–23)
CO2: 26 mEq/L (ref 19–32)
Calcium: 8.8 mg/dL (ref 8.4–10.5)
Chloride: 104 mEq/L (ref 96–112)
Creatinine, Ser: 1.02 mg/dL (ref 0.40–1.50)
GFR: 74.63 mL/min (ref >60.00)
Glucose, Bld: 112 mg/dL — ABNORMAL HIGH (ref 70–99)
Potassium: 4.9 mEq/L (ref 3.5–5.1)
Sodium: 137 mEq/L (ref 135–145)
Total Bilirubin: 0.8 mg/dL (ref 0.2–1.2)
Total Protein: 6.4 g/dL (ref 6.0–8.3)

## 2019-11-16 LAB — LIPID PANEL
Cholesterol: 95 mg/dL (ref 0–200)
HDL: 39.7 mg/dL (ref >39.00)
LDL Cholesterol: 39 mg/dL (ref 0–99)
NonHDL: 55.46
Total CHOL/HDL Ratio: 2
Triglycerides: 81 mg/dL (ref 0.0–149.0)
VLDL: 16.2 mg/dL (ref 0.0–40.0)

## 2019-11-16 LAB — TSH: TSH: 1.25 u[IU]/mL (ref 0.35–4.50)

## 2019-11-16 LAB — VITAMIN D 25 HYDROXY (VIT D DEFICIENCY, FRACTURES): VITD: 35.73 ng/mL (ref 30.00–100.00)

## 2019-11-16 NOTE — Progress Notes (Signed)
Established Patient Office Visit     This visit occurred during the SARS-CoV-2 public health emergency.  Safety protocols were in place, including screening questions prior to the visit, additional usage of staff PPE, and extensive cleaning of exam room while observing appropriate contact time as indicated for disinfecting solutions.    CC/Reason for Visit: Annual preventive exam  HPI: Matthew Stanley is a 59 y.o. male who is coming in today for the above mentioned reasons. Past Medical History is significant for: Coronary artery disease with recent non-ST elevated MI in November 2020, history of hypertension, hyperlipidemia and tobacco abuse for which he quit recently after his MI.    He has no acute complaints today and has been doing well since her last visit.  He is back to taking dual antiplatelet therapy.  He needs eye and dental care.  He is due for his second shingles vaccine today.  He is not convinced about getting his Covid vaccination.  He is overdue for screening colonoscopy.  His blood pressure is elevated in office today at 140/80, states he gets anxious when he comes in to see the doctor.  He brings in his home blood pressure measurements:  119/78 135/82 123/86 124/77 128/86 126/88   Past Medical/Surgical History: Past Medical History:  Diagnosis Date  . CAD in native artery 06/30/2019  . HLD (hyperlipidemia) 06/30/2019  . HTN (hypertension) - new dx 06/28/2019  . NSTEMI (non-ST elevated myocardial infarction) ([place]) 06/28/2019  . S/P angioplasty with stent 06/29/19 DES to LCX OM  06/30/2019  . Shingles 2019  . Tobacco use 06/28/2019    Past Surgical History:  Procedure Laterality Date  . CORONARY STENT INTERVENTION N/A 06/29/2019   Procedure: CORONARY STENT INTERVENTION;  Surgeon: Troy Sine, MD;  Location: [place] [facility];  Service: Cardiovascular;  Laterality: N/A;  . CORONARY THROMBECTOMY N/A 06/29/2019   Procedure: Coronary Thrombectomy;   Surgeon: Troy Sine, MD;  Location: [place] [facility];  Service: Cardiovascular;  Laterality: N/A;  . LEFT HEART CATH AND CORONARY ANGIOGRAPHY N/A 06/29/2019   Procedure: LEFT HEART CATH AND CORONARY ANGIOGRAPHY;  Surgeon: Troy Sine, MD;  Location: [place] [facility];  Service: Cardiovascular;  Laterality: N/A;  . None      Social History:  reports that he quit smoking about 4 months ago. His smoking use included cigarettes. He has a 40.00 pack-year smoking history. He has never used smokeless tobacco. He reports current alcohol use of about 1.0 standard drinks of alcohol per week. He reports that he does not use drugs.  Allergies: No Known Allergies  Family History:  Family History  Problem Relation Age of Onset  . Stroke Mother        died in his 37s  . Heart Problems Father        Died in her 74s     Current Outpatient Medications:  .  aspirin EC 81 MG tablet, Take 1 tablet (81 mg total) by mouth daily., Disp: 90 tablet, Rfl: 3 .  atorvastatin (LIPITOR) 80 MG tablet, Take 1 tablet (80 mg total) by mouth daily at 6 PM., Disp: 90 tablet, Rfl: 3 .  carvedilol (COREG) 12.5 MG tablet, Take 1 tablet (12.5 mg total) by mouth 2 (two) times daily with a meal., Disp: 180 tablet, Rfl: 3 .  nitroGLYCERIN (NITROSTAT) 0.4 MG SL tablet, Place 1 tablet (0.4 mg total) under the tongue every 5 (five) minutes x 3 doses as needed for  chest pain., Disp: 25 tablet, Rfl: 4 .  ticagrelor (BRILINTA) 90 MG TABS tablet, Take 1 tablet (90 mg total) by mouth 2 (two) times daily., Disp: 180 tablet, Rfl: 3  Review of Systems:  Constitutional: Denies fever, chills, diaphoresis, appetite change and fatigue.  HEENT: Denies photophobia, eye pain, redness, hearing loss, ear pain, congestion, sore throat, rhinorrhea, sneezing, mouth sores, trouble swallowing, neck pain, neck stiffness and tinnitus.   Respiratory: Denies SOB, DOE, cough, chest tightness,  and wheezing.   Cardiovascular: Denies chest pain,  palpitations and leg swelling.  Gastrointestinal: Denies nausea, vomiting, abdominal pain, diarrhea, constipation, blood in stool and abdominal distention.  Genitourinary: Denies dysuria, urgency, frequency, hematuria, flank pain and difficulty urinating.  Endocrine: Denies: hot or cold intolerance, sweats, changes in hair or nails, polyuria, polydipsia. Musculoskeletal: Denies myalgias, back pain, joint swelling, arthralgias and gait problem.  Skin: Denies pallor, rash and wound.  Neurological: Denies dizziness, seizures, syncope, weakness, light-headedness, numbness and headaches.  Hematological: Denies adenopathy. Easy bruising, personal or family bleeding history  Psychiatric/Behavioral: Denies suicidal ideation, mood changes, confusion, nervousness, sleep disturbance and agitation    Physical Exam: Vitals:   11/16/19 0652  BP: 140/80  Pulse: 66  Temp: (!) 97.1 F (36.2 C)  TempSrc: Temporal  SpO2: 98%  Weight: 203 lb 8 oz (92.3 kg)  Height: 5' 10.5" (1.791 m)    Body mass index is 28.79 kg/m.   Constitutional: NAD, calm, comfortable Eyes: PERRL, lids and conjunctivae normal ENMT: Mucous membranes are moist.  Tympanic membrane is pearly white, no erythema or bulging. Neck: normal, supple, no masses, no thyromegaly Respiratory: clear to auscultation bilaterally, no wheezing, no crackles. Normal respiratory effort. No accessory muscle use.  Cardiovascular: Regular rate and rhythm, no murmurs / rubs / gallops. No extremity edema. 2+ pedal pulses. No carotid bruits.  Abdomen: no tenderness, no masses palpated. No hepatosplenomegaly. Bowel sounds positive.  Musculoskeletal: no clubbing / cyanosis. No joint deformity upper and lower extremities. Good ROM, no contractures. Normal muscle tone.  Skin: no rashes, lesions, ulcers. No induration Neurologic: CN 2-12 grossly intact. Sensation intact, DTR normal. Strength 5/5 in all 4.  Psychiatric: Normal judgment and insight. Alert and  oriented x 3. Normal mood.    Impression and Plan:  Encounter for preventive health examination  -Have advised routine eye and dental care. -To receive second shingles vaccine, refuses Covid vaccination, otherwise immunizations are up-to-date. -Screening labs today. -Healthy lifestyle discussed in detail. -PSA today. -Refer to GI for screening colonoscopy.  Screening for malignant neoplasm of colon  - Plan: Ambulatory referral to Gastroenterology  CAD in native artery NSTEMI (non-ST elevated myocardial infarction) ([place]) -Doing well, has been exercising, no chest pain, no shortness of breath, has had no need to use nitroglycerin. -Recent follow-up with cardiology.  Mixed hyperlipidemia  - Plan: Lipid panel -Last LDL was 81 in November, goal less than 70. -He remains on high intensity statin, atorvastatin 80 mg.  Essential hypertension -Even though blood pressure in office today is elevated, home measurements indicate good control. -Continue current regimen.  Tobacco use -He quit smoking after his MI in November.    Patient Instructions  -Nice seeing you today!!  -Lab work today; will notify you once results are available.  -Second shingles vaccine today.  -Referral to GI placed today for colonoscopy.  -Make sure you schedule eye and dental exams.  -Please consider COVID vaccination.  -Schedule follow up in 4 months.   Preventive Care 25-62 Years Old, Male Preventive care  refers to lifestyle choices and visits with your health care provider that can promote health and wellness. This includes:  A yearly physical exam. This is also called an annual well check.  Regular dental and eye exams.  Immunizations.  Screening for certain conditions.  Healthy lifestyle choices, such as eating a healthy diet, getting regular exercise, not using drugs or products that contain nicotine and tobacco, and limiting alcohol use. What can I expect for my preventive care  visit? Physical exam Your health care provider will check:  Height and weight. These may be used to calculate body mass index (BMI), which is a measurement that tells if you are at a healthy weight.  Heart rate and blood pressure.  Your skin for abnormal spots. Counseling Your health care provider may ask you questions about:  Alcohol, tobacco, and drug use.  Emotional well-being.  Home and relationship well-being.  Sexual activity.  Eating habits.  Work and work Statistician. What immunizations do I need?  Influenza (flu) vaccine  This is recommended every year. Tetanus, diphtheria, and pertussis (Tdap) vaccine  You may need a Td booster every 10 years. Varicella (chickenpox) vaccine  You may need this vaccine if you have not already been vaccinated. Zoster (shingles) vaccine  You may need this after age 93. Measles, mumps, and rubella (MMR) vaccine  You may need at least one dose of MMR if you were born in 1957 or later. You may also need a second dose. Pneumococcal conjugate (PCV13) vaccine  You may need this if you have certain conditions and were not previously vaccinated. Pneumococcal polysaccharide (PPSV23) vaccine  You may need one or two doses if you smoke cigarettes or if you have certain conditions. Meningococcal conjugate (MenACWY) vaccine  You may need this if you have certain conditions. Hepatitis A vaccine  You may need this if you have certain conditions or if you travel or work in places where you may be exposed to hepatitis A. Hepatitis B vaccine  You may need this if you have certain conditions or if you travel or work in places where you may be exposed to hepatitis B. Haemophilus influenzae type b (Hib) vaccine  You may need this if you have certain risk factors. Human papillomavirus (HPV) vaccine  If recommended by your health care provider, you may need three doses over 6 months. You may receive vaccines as individual doses or as more  than one vaccine together in one shot (combination vaccines). Talk with your health care provider about the risks and benefits of combination vaccines. What tests do I need? Blood tests  Lipid and cholesterol levels. These may be checked every 5 years, or more frequently if you are over 64 years old.  Hepatitis C test.  Hepatitis B test. Screening  Lung cancer screening. You may have this screening every year starting at age 95 if you have a 30-pack-year history of smoking and currently smoke or have quit within the past 15 years.  Prostate cancer screening. Recommendations will vary depending on your family history and other risks.  Colorectal cancer screening. All adults should have this screening starting at age 59 and continuing until age 30. Your health care provider may recommend screening at age 36 if you are at increased risk. You will have tests every 1-10 years, depending on your results and the type of screening test.  Diabetes screening. This is done by checking your blood sugar (glucose) after you have not eaten for a while (fasting). You may have  this done every 1-3 years.  Sexually transmitted disease (STD) testing. Follow these instructions at home: Eating and drinking  Eat a diet that includes fresh fruits and vegetables, whole grains, lean protein, and low-fat dairy products.  Take vitamin and mineral supplements as recommended by your health care provider.  Do not drink alcohol if your health care provider tells you not to drink.  If you drink alcohol: ? Limit how much you have to 0-2 drinks a day. ? Be aware of how much alcohol is in your drink. In the [place] one drink equals one 12 oz bottle of beer (355 mL), one 5 oz glass of wine (148 mL), or one 1 oz glass of hard liquor (44 mL). Lifestyle  Take daily care of your teeth and gums.  Stay active. Exercise for at least 30 minutes on 5 or more days each week.  Do not use any products that contain nicotine or  tobacco, such as cigarettes, e-cigarettes, and chewing tobacco. If you need help quitting, ask your health care provider.  If you are sexually active, practice safe sex. Use a condom or other form of protection to prevent STIs (sexually transmitted infections).  Talk with your health care provider about taking a low-dose aspirin every day starting at age 39. What's next?  Go to your health care provider once a year for a well check visit.  Ask your health care provider how often you should have your eyes and teeth checked.  Stay up to date on all vaccines. This information is not intended to replace advice given to you by your health care provider. Make sure you discuss any questions you have with your health care provider. Document Revised: 07/22/2018 Document Reviewed: 07/22/2018 Elsevier Patient Education  2020 [place], MD [facility]

## 2019-11-16 NOTE — Patient Instructions (Signed)
-Nice seeing you today!!  -Lab work today; will notify you once results are available.  -Second shingles vaccine today.  -Referral to GI placed today for colonoscopy.  -Make sure you schedule eye and dental exams.  -Please consider COVID vaccination.  -Schedule follow up in 4 months.   Preventive Care 22-60 Years Old, Male Preventive care refers to lifestyle choices and visits with your health care provider that can promote health and wellness. This includes:  A yearly physical exam. This is also called an annual well check.  Regular dental and eye exams.  Immunizations.  Screening for certain conditions.  Healthy lifestyle choices, such as eating a healthy diet, getting regular exercise, not using drugs or products that contain nicotine and tobacco, and limiting alcohol use. What can I expect for my preventive care visit? Physical exam Your health care provider will check:  Height and weight. These may be used to calculate body mass index (BMI), which is a measurement that tells if you are at a healthy weight.  Heart rate and blood pressure.  Your skin for abnormal spots. Counseling Your health care provider may ask you questions about:  Alcohol, tobacco, and drug use.  Emotional well-being.  Home and relationship well-being.  Sexual activity.  Eating habits.  Work and work Statistician. What immunizations do I need?  Influenza (flu) vaccine  This is recommended every year. Tetanus, diphtheria, and pertussis (Tdap) vaccine  You may need a Td booster every 10 years. Varicella (chickenpox) vaccine  You may need this vaccine if you have not already been vaccinated. Zoster (shingles) vaccine  You may need this after age 34. Measles, mumps, and rubella (MMR) vaccine  You may need at least one dose of MMR if you were born in 1957 or later. You may also need a second dose. Pneumococcal conjugate (PCV13) vaccine  You may need this if you have certain  conditions and were not previously vaccinated. Pneumococcal polysaccharide (PPSV23) vaccine  You may need one or two doses if you smoke cigarettes or if you have certain conditions. Meningococcal conjugate (MenACWY) vaccine  You may need this if you have certain conditions. Hepatitis A vaccine  You may need this if you have certain conditions or if you travel or work in places where you may be exposed to hepatitis A. Hepatitis B vaccine  You may need this if you have certain conditions or if you travel or work in places where you may be exposed to hepatitis B. Haemophilus influenzae type b (Hib) vaccine  You may need this if you have certain risk factors. Human papillomavirus (HPV) vaccine  If recommended by your health care provider, you may need three doses over 6 months. You may receive vaccines as individual doses or as more than one vaccine together in one shot (combination vaccines). Talk with your health care provider about the risks and benefits of combination vaccines. What tests do I need? Blood tests  Lipid and cholesterol levels. These may be checked every 5 years, or more frequently if you are over 42 years old.  Hepatitis C test.  Hepatitis B test. Screening  Lung cancer screening. You may have this screening every year starting at age 45 if you have a 30-pack-year history of smoking and currently smoke or have quit within the past 15 years.  Prostate cancer screening. Recommendations will vary depending on your family history and other risks.  Colorectal cancer screening. All adults should have this screening starting at age 75 and continuing until age  21. Your health care provider may recommend screening at age 56 if you are at increased risk. You will have tests every 1-10 years, depending on your results and the type of screening test.  Diabetes screening. This is done by checking your blood sugar (glucose) after you have not eaten for a while (fasting). You may  have this done every 1-3 years.  Sexually transmitted disease (STD) testing. Follow these instructions at home: Eating and drinking  Eat a diet that includes fresh fruits and vegetables, whole grains, lean protein, and low-fat dairy products.  Take vitamin and mineral supplements as recommended by your health care provider.  Do not drink alcohol if your health care provider tells you not to drink.  If you drink alcohol: ? Limit how much you have to 0-2 drinks a day. ? Be aware of how much alcohol is in your drink. In the [place] one drink equals one 12 oz bottle of beer (355 mL), one 5 oz glass of wine (148 mL), or one 1 oz glass of hard liquor (44 mL). Lifestyle  Take daily care of your teeth and gums.  Stay active. Exercise for at least 30 minutes on 5 or more days each week.  Do not use any products that contain nicotine or tobacco, such as cigarettes, e-cigarettes, and chewing tobacco. If you need help quitting, ask your health care provider.  If you are sexually active, practice safe sex. Use a condom or other form of protection to prevent STIs (sexually transmitted infections).  Talk with your health care provider about taking a low-dose aspirin every day starting at age 10. What's next?  Go to your health care provider once a year for a well check visit.  Ask your health care provider how often you should have your eyes and teeth checked.  Stay up to date on all vaccines. This information is not intended to replace advice given to you by your health care provider. Make sure you discuss any questions you have with your health care provider. Document Revised: 07/22/2018 Document Reviewed: 07/22/2018 Elsevier Patient Education  2020 [employer].

## 2019-11-16 NOTE — Addendum Note (Signed)
Addended by: Kern Reap B on: 11/16/2019 04:26 PM   Modules accepted: Orders

## 2020-02-27 ENCOUNTER — Other Ambulatory Visit: Payer: Self-pay | Admitting: Interventional Cardiology

## 2020-08-15 ENCOUNTER — Ambulatory Visit: Payer: Managed Care, Other (non HMO) | Admitting: Interventional Cardiology

## 2020-09-03 NOTE — Progress Notes (Signed)
Cardiology Office Note   Date:  09/04/2020   ID:  Matthew Stanley, Matthew Stanley 1960/09/28, MRN [id number]  PCP:  Matthew Stanley, Matthew Patricia, MD    No chief complaint on file.  CAD  Wt Readings from Last 3 Encounters:  09/04/20 219 lb (99.3 kg)  11/16/19 203 lb 8 oz (92.3 kg)  11/01/19 205 lb (93 kg)       History of Present Illness: Matthew Stanley is a 60 y.o. male  With CAD. Cath in 11/20 showed: "Cardiac cath 06/29/19  1st Mrg lesion is 95% stenosed.  Mid Cx lesion is 20% stenosed.  Dist Cx lesion is 30% stenosed.  2nd Diag lesion is 80% stenosed.  Mid LAD lesion is 40% stenosed.  Post intervention, there is a 0% residual stenosis.  Prox RCA lesion is 20% stenosed.  A stent was successfully placed.  The LAD has mild irregularity proximally with 40% narrowing. The second diagonal vessel is very small caliber and had 80% proximal stenosis in the vessel less than 1.5 mm.   Left circumflex vessel is a dominant vessel with subtotal ostial to proximal obtuse marginal stenosis representing the culprit lesion in the patient's ACS/non-STEMI. There is thrombus in the AV groove circumflex with 20% narrowing, and 30% mid distal AV groove stenosis.  Small nondominant RCA with mild 20% narrowing in the region of the more marginal marginal takeoff.  Preserved global LV contractility with a subtle region of mid anterolateral hypocontractility. LVEDP 10 mm.  Difficult but successful percutaneous coronary intervention involving the circumflex marginal vessel treated with PTCA, toe thrombectomy, with subsequent proximal marginal dissection successfully treated with ultimate stenting utilizing a 2.25 x 38 mm Resolute Onyx stent with the stenoses being reduced to 0%.  RECOMMENDATION: DAPT for minimum of 1 year. Smoking cessation is imperative. Aggressive lipid-lowering therapy high potency statin therapy and with target LDL less than 80%."  He has continued to avoid  tobacco.  BP at home are typically in the 112-125 range systolic.  Higher in the MDs office.   He is trying to eat healthy.  He limits salt intake.    He wears mask and socially distances.  He is not vaccinated, and somewhat alarmed about getting the vaccine side effects- pericarditis.  He would prefer Novavax when available.   Informed him that [facility] Benes is not mRNA vaccine.     Past Medical History:  Diagnosis Date  . CAD in native artery 06/30/2019  . HLD (hyperlipidemia) 06/30/2019  . HTN (hypertension) - new dx 06/28/2019  . NSTEMI (non-ST elevated myocardial infarction) (HCC) 06/28/2019  . S/P angioplasty with stent 06/29/19 DES to LCX OM  06/30/2019  . Shingles 2019  . Tobacco use 06/28/2019    Past Surgical History:  Procedure Laterality Date  . CORONARY STENT INTERVENTION N/A 06/29/2019   Procedure: CORONARY STENT INTERVENTION;  Surgeon: Lennette Bihari, MD;  Location: [facility] INVASIVE CV [facility];  Service: Cardiovascular;  Laterality: N/A;  . CORONARY THROMBECTOMY N/A 06/29/2019   Procedure: Coronary Thrombectomy;  Surgeon: Lennette Bihari, MD;  Location: [facility] INVASIVE CV LAB;  Service: Cardiovascular;  Laterality: N/A;  . LEFT HEART CATH AND CORONARY ANGIOGRAPHY N/A 06/29/2019   Procedure: LEFT HEART CATH AND CORONARY ANGIOGRAPHY;  Surgeon: Lennette Bihari, MD;  Location: [facility] INVASIVE CV LAB;  Service: Cardiovascular;  Laterality: N/A;  . None       Current Outpatient Medications  Medication Sig Dispense Refill  . aspirin EC 81 MG tablet Take  1 tablet (81 mg total) by mouth daily. 90 tablet 3  . atorvastatin (LIPITOR) 80 MG tablet Take 1 tablet (80 mg total) by mouth daily at 6 PM. 90 tablet 3  . carvedilol (COREG) 12.5 MG tablet Take 1 tablet (12.5 mg total) by mouth 2 (two) times daily with a meal. 180 tablet 3  . nitroGLYCERIN (NITROSTAT) 0.4 MG SL tablet PLACE 1 TABLET (0.4 MG TOTAL) UNDER THE TONGUE EVERY 5 MINUTES X 3 DOSES AS NEEDED FOR CHEST PAIN. 75 tablet 1  .  ticagrelor (BRILINTA) 90 MG TABS tablet Take 1 tablet (90 mg total) by mouth 2 (two) times daily. 180 tablet 3   No current facility-administered medications for this visit.    Allergies:   Patient has no known allergies.    Social History:  The patient  reports that he quit smoking about 14 months ago. His smoking use included cigarettes. He has a 40.00 pack-year smoking history. He has never used smokeless tobacco. He reports current alcohol use of about 1.0 standard drink of alcohol per week. He reports that he does not use drugs.   Family History:  The patient's family history includes Heart Problems in his father; Stroke in his mother.    ROS:  Please see the history of present illness.   Otherwise, review of systems are positive for less exercise than he would like.   All other systems are reviewed and negative.    PHYSICAL EXAM: VS:  BP (!) 140/110 Comment: left arm 160/110  Pulse 60   Ht 5\' 11"  (1.803 m)   Wt 219 lb (99.3 kg)   SpO2 98%   BMI 30.54 kg/m  , BMI Body mass index is 30.54 kg/m. GEN: Well nourished, well developed, in no acute distress  HEENT: normal  Neck: no JVD, carotid bruits, or masses Cardiac: RRR; no murmurs, rubs, or gallops,no edema  Respiratory:  clear to auscultation bilaterally, normal work of breathing GI: soft, nontender, nondistended, + BS MS: no deformity or atrophy  Skin: warm and dry, no rash Neuro:  Strength and sensation are intact Psych: euthymic mood, full affect   EKG:   The ekg ordered today demonstrates normal ECG   Recent Labs: 11/16/2019: ALT 34; BUN 13; Creatinine, Ser 1.02; Hemoglobin 14.3; Platelets 256.0; Potassium 4.9; Sodium 137; TSH 1.25   Lipid Panel    Component Value Date/Time   CHOL 95 11/16/2019 0738   TRIG 81.0 11/16/2019 0738   HDL 39.70 11/16/2019 0738   CHOLHDL 2 11/16/2019 0738   VLDL 16.2 11/16/2019 0738   LDLCALC 39 11/16/2019 0738     Other studies Reviewed: Additional studies/ records that were  reviewed today with results demonstrating: labs reviewed from 4/21.   ASSESSMENT AND PLAN:  1.   CAD/Old MI: No angina. Continue aggressive secondary prevention.  Stop Brilinta.  Start Pplavix 75 mg daily. 2.   HTN: Readings at home are controlled. The current medical regimen is effective;  continue present plan and medications.  High readings in the MDs office. 3.   Tobacco abuse: quit smoking in 2020.  Continue to abstain.  4.  Hyperlipidemia: LDL 39 in 4/21.  Continue atorvastatin.    Current medicines are reviewed at length with the patient today.  The patient concerns regarding his medicines were addressed.  The following changes have been made:  No change  Labs/ tests ordered today include:  No orders of the defined types were placed in this encounter.   Recommend 150 minutes/week of  aerobic exercise Low fat, low carb, high fiber diet recommended  Disposition:   FU in 1 year   Signed, Lance Muss, MD  09/04/2020 9:54 AM    [facility] [address] [place], [place], [place]  [address] Phone: [phone]; Fax: [phone]

## 2020-09-04 ENCOUNTER — Ambulatory Visit: Payer: Managed Care, Other (non HMO) | Admitting: Interventional Cardiology

## 2020-09-04 ENCOUNTER — Encounter: Payer: Self-pay | Admitting: Interventional Cardiology

## 2020-09-04 ENCOUNTER — Other Ambulatory Visit: Payer: Self-pay

## 2020-09-04 VITALS — BP 140/110 | HR 60 | Ht 71.0 in | Wt 219.0 lb

## 2020-09-04 DIAGNOSIS — I10 Essential (primary) hypertension: Secondary | ICD-10-CM | POA: Diagnosis not present

## 2020-09-04 DIAGNOSIS — E782 Mixed hyperlipidemia: Secondary | ICD-10-CM | POA: Diagnosis not present

## 2020-09-04 DIAGNOSIS — I25118 Atherosclerotic heart disease of native coronary artery with other forms of angina pectoris: Secondary | ICD-10-CM

## 2020-09-04 MED ORDER — CLOPIDOGREL BISULFATE 75 MG PO TABS: 75.0000 mg | ORAL_TABLET | Freq: Every day | ORAL | 3 refills | Status: DC

## 2020-09-04 NOTE — Patient Instructions (Addendum)
Medication Instructions:  Your physician has recommended you make the following change in your medication:  1.) STOP:  Brilinta   2.) START:clopidogrel (Plavix) 75mg  daily  *If you need a refill on your cardiac medications before your next appointment, please call your pharmacy*   Lab Work: NONE If you have labs (blood work) drawn today and your tests are completely normal, you will receive your results only by: MyChart Message (if you have MyChart) OR . A paper copy in the mail If you have any lab test that is abnormal or we need to change your treatment, we will call you to review the results.   Testing/Procedures: NONE   Follow-Up: At [facility], you and your health needs are our priority.  As part of our continuing mission to provide you with exceptional heart care, we have created designated Provider Care Teams.  These Care Teams include your primary Cardiologist (physician) and Advanced Practice Providers (APPs -  Physician Assistants and Nurse Practitioners) who all work together to provide you with the care you need, when you need it.  We recommend signing up for the patient portal called "MyChart".  Sign up information is provided on this After Visit Summary.  MyChart is used to connect with patients for Virtual Visits (Telemedicine).  Patients are able to view lab/test results, encounter notes, upcoming appointments, etc.  Non-urgent messages can be sent to your provider as well.   To learn more about what you can do with MyChart, go to [facility].    Your next appointment:   6 month(s)  The format for your next appointment:   In Person  Provider:   You may see [email or web address], MD or one of the following Advanced Practice Providers on your designated Care Team:    Lance Muss, PA-C  Ronie Spies, PA-C  Other Instructions  High-Fiber Eating Plan Fiber, also called dietary fiber, is a type of carbohydrate. It is found foods such as fruits,  vegetables, whole grains, and beans. A high-fiber diet can have many health benefits. Your health care provider may recommend a high-fiber diet to help:  Prevent constipation. Fiber can make your bowel movements more regular.  Lower your cholesterol.  Relieve the following conditions: ? Inflammation of veins in the anus (hemorrhoids). ? Inflammation of specific areas of the digestive tract (uncomplicated diverticulosis). ? A problem of the large intestine, also called the colon, that sometimes causes pain and diarrhea (irritable bowel syndrome, or IBS).  Prevent overeating as part of a weight-loss plan.  Prevent heart disease, type 2 diabetes, and certain cancers. What are tips for following this plan? Reading food labels  Check the nutrition facts label on food products for the amount of dietary fiber. Choose foods that have 5 grams of fiber or more per serving.  The goals for recommended daily fiber intake include: ? Men (age 69 or younger): 34-38 g. ? Men (over age 55): 28-34 g. ? Women (age 3 or younger): 25-28 g. ? Women (over age 73): 22-25 g. Your daily fiber goal is _____________ g.   Shopping  Choose whole fruits and vegetables instead of processed forms, such as apple juice or applesauce.  Choose a wide variety of high-fiber foods such as avocados, lentils, oats, and kidney beans.  Read the nutrition facts label of the foods you choose. Be aware of foods with added fiber. These foods often have high sugar and sodium amounts per serving. Cooking  Use whole-grain flour for baking and cooking.  44  with brown rice instead of white rice. Meal planning  Start the day with a breakfast that is high in fiber, such as a cereal that contains 5 g of fiber or more per serving.  Eat breads and cereals that are made with whole-grain flour instead of refined flour or white flour.  Eat brown rice, bulgur wheat, or millet instead of white rice.  Use beans in place of meat in  soups, salads, and pasta dishes.  Be sure that half of the grains you eat each day are whole grains. General information  You can get the recommended daily intake of dietary fiber by: ? Eating a variety of fruits, vegetables, grains, nuts, and beans. ? Taking a fiber supplement if you are not able to take in enough fiber in your diet. It is better to get fiber through food than from a supplement.  Gradually increase how much fiber you consume. If you increase your intake of dietary fiber too quickly, you may have bloating, cramping, or gas.  Drink plenty of water to help you digest fiber.  Choose high-fiber snacks, such as berries, raw vegetables, nuts, and popcorn. What foods should I eat? Fruits Berries. Pears. Apples. Oranges. Avocado. Prunes and raisins. Dried figs. Vegetables Sweet potatoes. Spinach. Kale. Artichokes. Cabbage. Broccoli. Cauliflower. Green peas. Carrots. Squash. Grains Whole-grain breads. Multigrain cereal. Oats and oatmeal. Brown rice. Barley. Bulgur wheat. Millet. Quinoa. Bran muffins. Popcorn. Rye wafer crackers. Meats and other proteins Navy beans, kidney beans, and pinto beans. Soybeans. Split peas. Lentils. Nuts and seeds. Dairy Fiber-fortified yogurt. Beverages Fiber-fortified soy milk. Fiber-fortified orange juice. Other foods Fiber bars. The items listed above may not be a complete list of recommended foods and beverages. Contact a dietitian for more information. What foods should I avoid? Fruits Fruit juice. Cooked, strained fruit. Vegetables Fried potatoes. Canned vegetables. Well-cooked vegetables. Grains White bread. Pasta made with refined flour. White rice. Meats and other proteins Fatty cuts of meat. Fried chicken or fried fish. Dairy Milk. Yogurt. Cream cheese. Sour cream. Fats and oils Butters. Beverages Soft drinks. Other foods Cakes and pastries. The items listed above may not be a complete list of foods and beverages to avoid.  Talk with your dietitian about what choices are best for you. Summary  Fiber is a type of carbohydrate. It is found in foods such as fruits, vegetables, whole grains, and beans.  A high-fiber diet has many benefits. It can help to prevent constipation, lower blood cholesterol, aid weight loss, and reduce your risk of heart disease, diabetes, and certain cancers.  Increase your intake of fiber gradually. Increasing fiber too quickly may cause cramping, bloating, and gas. Drink plenty of water while you increase the amount of fiber you consume.  The best sources of fiber include whole fruits and vegetables, whole grains, nuts, seeds, and beans. This information is not intended to replace advice given to you by your health care provider. Make sure you discuss any questions you have with your health care provider. Document Revised: 12/01/2019 Document Reviewed: 12/01/2019 Elsevier Patient Education  2021 [employer].

## 2020-11-17 ENCOUNTER — Other Ambulatory Visit: Payer: Self-pay | Admitting: Interventional Cardiology

## 2020-11-17 DIAGNOSIS — I10 Essential (primary) hypertension: Secondary | ICD-10-CM

## 2020-11-17 DIAGNOSIS — I2510 Atherosclerotic heart disease of native coronary artery without angina pectoris: Secondary | ICD-10-CM

## 2021-01-21 ENCOUNTER — Other Ambulatory Visit: Payer: Self-pay | Admitting: Interventional Cardiology

## 2021-04-10 NOTE — Progress Notes (Signed)
Cardiology Office Note   Date:  04/11/2021   ID:  Matthew Stanley, DOB Oct 30, 1960, MRN [id number]  PCP:  Philip Aspen, [name], MD    No chief complaint on file.    Wt Readings from Last 3 Encounters:  04/11/21 217 lb 9.6 oz (98.7 kg)  09/04/20 219 lb (99.3 kg)  11/16/19 203 lb 8 oz (92.3 kg)       History of Present Illness: Matthew Stanley is a 60 y.o. male   With CAD.  Cath in 11/20 showed: "Cardiac cath 06/29/19 1st Mrg lesion is 95% stenosed. Mid Cx lesion is 20% stenosed. Dist Cx lesion is 30% stenosed. 2nd Diag lesion is 80% stenosed. Mid LAD lesion is 40% stenosed. Post intervention, there is a 0% residual stenosis. Prox RCA lesion is 20% stenosed. A stent was successfully placed.   The LAD has mild irregularity proximally with 40% narrowing.  The second diagonal vessel is very small caliber and had 80% proximal stenosis in the vessel less than 1.5 mm.    Left circumflex vessel is a dominant vessel with subtotal ostial to proximal obtuse marginal stenosis representing the culprit lesion in the patient's ACS/non-STEMI.  There is thrombus in the AV groove circumflex with 20% narrowing, and 30% mid distal AV groove stenosis.   Small nondominant RCA with mild 20% narrowing in the region of the more marginal marginal takeoff.   Preserved global LV contractility with a subtle region of mid anterolateral hypocontractility.  LVEDP 10 mm.   Difficult but successful percutaneous coronary intervention involving the circumflex marginal vessel treated with PTCA, toe thrombectomy, with subsequent proximal marginal dissection successfully treated with ultimate stenting utilizing a 2.25 x 38 mm Resolute Onyx stent with the stenoses being reduced to 0%.   RECOMMENDATION: DAPT for minimum of 1 year.  Smoking cessation is imperative.  Aggressive lipid-lowering therapy high potency statin therapy and with target LDL less than 80%. "   Noted in early 2022: "He has  continued to avoid tobacco.  BP at home are typically in the 112-125 range systolic.  Higher in the MDs office.    He is trying to eat healthy.  He limits salt intake.     He wears mask and socially distances.  He is not vaccinated, and somewhat alarmed about getting the vaccine side effects- pericarditis.  He would prefer Novavax when available.   Informed him that [facility] is not mRNA vaccine. "   He has had some stress from work.   Denies : Chest pain. Dizziness. Leg edema. Nitroglycerin use. Orthopnea. Palpitations. Paroxysmal nocturnal dyspnea. Shortness of breath. Syncope.    Walking has decreased at work.  Stays active on the weekends.  Does yardwork.   Still has not been vaccinated. Still staying out of crowds.   Past Medical History:  Diagnosis Date   CAD in native artery 06/30/2019   HLD (hyperlipidemia) 06/30/2019   HTN (hypertension) - new dx 06/28/2019   NSTEMI (non-ST elevated myocardial infarction) (HCC) 06/28/2019   S/P angioplasty with stent 06/29/19 DES to LCX OM  06/30/2019   Shingles 2019   Tobacco use 06/28/2019    Past Surgical History:  Procedure Laterality Date   CORONARY STENT INTERVENTION N/A 06/29/2019   Procedure: CORONARY STENT INTERVENTION;  Surgeon: Lennette Bihari, MD;  Location: [facility];  Service: Cardiovascular;  Laterality: N/A;   CORONARY THROMBECTOMY N/A 06/29/2019   Procedure: Coronary Thrombectomy;  Surgeon: Lennette Bihari, MD;  Location:  [facility];  Service: Cardiovascular;  Laterality: N/A;   LEFT HEART CATH AND CORONARY ANGIOGRAPHY N/A 06/29/2019   Procedure: LEFT HEART CATH AND CORONARY ANGIOGRAPHY;  Surgeon: Lennette Bihari, MD;  Location: [facility];  Service: Cardiovascular;  Laterality: N/A;   None       Current Outpatient Medications  Medication Sig Dispense Refill   aspirin EC 81 MG tablet Take 1 tablet (81 mg total) by mouth daily. 90 tablet 3   atorvastatin (LIPITOR) 80 MG tablet TAKE 1  TABLET (80 MG TOTAL) BY MOUTH DAILY AT 6 PM. 90 tablet 3   carvedilol (COREG) 12.5 MG tablet Take 1 tablet (12.5 mg total) by mouth 2 (two) times daily with a meal. 180 tablet 2   clopidogrel (PLAVIX) 75 MG tablet Take 1 tablet (75 mg total) by mouth daily. 90 tablet 3   nitroGLYCERIN (NITROSTAT) 0.4 MG SL tablet PLACE 1 TABLET (0.4 MG TOTAL) UNDER THE TONGUE EVERY 5 MINUTES X 3 DOSES AS NEEDED FOR CHEST PAIN. 75 tablet 1   No current facility-administered medications for this visit.    Allergies:   Patient has no known allergies.    Social History:  The patient  reports that he quit smoking about 21 months ago. His smoking use included cigarettes. He has a 40.00 pack-year smoking history. He has never used smokeless tobacco. He reports current alcohol use of about 1.0 standard drink per week. He reports that he does not use drugs.   Family History:  The patient's family history includes Heart Problems in his father; Stroke in his mother.    ROS:  Please see the history of present illness.   Otherwise, review of systems are positive for foot pain- improved with new shoes.   All other systems are reviewed and negative.    PHYSICAL EXAM: VS:  BP (!) 160/100   Pulse 87   Ht 5\' 11"  (1.803 m)   Wt 217 lb 9.6 oz (98.7 kg)   SpO2 92%   BMI 30.35 kg/m  , BMI Body mass index is 30.35 kg/m. GEN: Well nourished, well developed, in no acute distress HEENT: normal Neck: no JVD, carotid bruits, or masses Cardiac: RRR; no murmurs, rubs, or gallops,no edema  Respiratory:  clear to auscultation bilaterally, normal work of breathing GI: soft, nontender, nondistended, + BS MS: no deformity or atrophy Skin: warm and dry, no rash Neuro:  Strength and sensation are intact Psych: euthymic mood, full affect    Recent Labs: No results found for requested labs within last 8760 hours.   Lipid Panel    Component Value Date/Time   CHOL 95 11/16/2019 0738   TRIG 81.0 11/16/2019 0738   HDL 39.70  11/16/2019 0738   CHOLHDL 2 11/16/2019 0738   VLDL 16.2 11/16/2019 0738   LDLCALC 39 11/16/2019 0738     Other studies Reviewed: Additional studies/ records that were reviewed today with results demonstrating: labs reviewed.   ASSESSMENT AND PLAN:  CAD/Old MI: No angina. Continue aggressive secondary prevention.  Mild bruising on Plavix.  No blood in Stool or urine. HTN: The current medical regimen is effective;  continue present plan and medications.  BP at home in the 120/70s.  When stressed, it can be in the 130s. High today.  Typically higher at the MDs office.  160/98 on my recheck.  He will let 01/16/2020 know if home blood pressure readings increase.  If blood pressure did increase, would add ACE inhibitor. Tobacco abuse:  Still abstaining.  Wife has also quit. Hyperlipidemia: LDL 39.  Due for lipids and liver check. PreDM: Try to increase activity to target below.  Increase fiber in diet.  Check hemoglobin A1c today.   Current medicines are reviewed at length with the patient today.  The patient concerns regarding his medicines were addressed.  The following changes have been made:  No change  Labs/ tests ordered today include:  No orders of the defined types were placed in this encounter.   Recommend 150 minutes/week of aerobic exercise Low fat, low carb, high fiber diet recommended  Disposition:   FU in June 2023   Signed, Lance Muss, MD  04/11/2021 10:19 AM    [facility] [address] [employer] [place], [place], [place]  [address] Phone: [phone]; Fax: [phone]

## 2021-04-11 ENCOUNTER — Other Ambulatory Visit: Payer: Self-pay

## 2021-04-11 ENCOUNTER — Encounter: Payer: Self-pay | Admitting: Interventional Cardiology

## 2021-04-11 ENCOUNTER — Ambulatory Visit: Payer: Managed Care, Other (non HMO) | Admitting: Interventional Cardiology

## 2021-04-11 VITALS — BP 160/100 | HR 87 | Ht 71.0 in | Wt 217.6 lb

## 2021-04-11 DIAGNOSIS — E782 Mixed hyperlipidemia: Secondary | ICD-10-CM | POA: Diagnosis not present

## 2021-04-11 DIAGNOSIS — R7303 Prediabetes: Secondary | ICD-10-CM

## 2021-04-11 DIAGNOSIS — I10 Essential (primary) hypertension: Secondary | ICD-10-CM

## 2021-04-11 DIAGNOSIS — I252 Old myocardial infarction: Secondary | ICD-10-CM | POA: Diagnosis not present

## 2021-04-11 DIAGNOSIS — I25118 Atherosclerotic heart disease of native coronary artery with other forms of angina pectoris: Secondary | ICD-10-CM

## 2021-04-11 DIAGNOSIS — Z720 Tobacco use: Secondary | ICD-10-CM

## 2021-04-11 NOTE — Patient Instructions (Signed)
Medication Instructions:  Your physician recommends that you continue on your current medications as directed. Please refer to the Current Medication list given to you today.  *If you need a refill on your cardiac medications before your next appointment, please call your pharmacy*   Lab Work: Lab work to be done today--CMET, Lipids, CBC, A1C If you have labs (blood work) drawn today and your tests are completely normal, you will receive your results only by: MyChart Message (if you have MyChart) OR A paper copy in the mail If you have any lab test that is abnormal or we need to change your treatment, we will call you to review the results.   Testing/Procedures: none   Follow-Up: At [facility], you and your health needs are our priority.  As part of our continuing mission to provide you with exceptional heart care, we have created designated Provider Care Teams.  These Care Teams include your primary Cardiologist (physician) and Advanced Practice Providers (APPs -  Physician Assistants and Nurse Practitioners) who all work together to provide you with the care you need, when you need it.  We recommend signing up for the patient portal called "MyChart".  Sign up information is provided on this After Visit Summary.  MyChart is used to connect with patients for Virtual Visits (Telemedicine).  Patients are able to view lab/test results, encounter notes, upcoming appointments, etc.  Non-urgent messages can be sent to your provider as well.   To learn more about what you can do with MyChart, go to [email or web address].    Your next appointment:   10 month(s)  The format for your next appointment:   In Person  Provider:   You may see Lance Muss, MD or one of the following Advanced Practice Providers on your designated Care Team:   Ronie Spies, PA-C Jacolyn Reedy, PA-C   Other Instructions

## 2021-04-12 LAB — LIPID PANEL
Chol/HDL Ratio: 2.8 ratio (ref 0.0–5.0)
Cholesterol, Total: 96 mg/dL — ABNORMAL LOW (ref 100–199)
HDL: 34 mg/dL — ABNORMAL LOW (ref >39)
LDL Chol Calc (NIH): 46 mg/dL (ref 0–99)
Triglycerides: 80 mg/dL (ref 0–149)
VLDL Cholesterol Cal: 16 mg/dL (ref 5–40)

## 2021-04-12 LAB — COMPREHENSIVE METABOLIC PANEL
ALT: 39 IU/L (ref 0–44)
AST: 30 IU/L (ref 0–40)
Albumin/Globulin Ratio: 1.8 (ref 1.2–2.2)
Albumin: 4 g/dL (ref 3.8–4.9)
Alkaline Phosphatase: 115 IU/L (ref 44–121)
BUN/Creatinine Ratio: 10 (ref 10–24)
BUN: 10 mg/dL (ref 8–27)
Bilirubin Total: 0.5 mg/dL (ref 0.0–1.2)
CO2: 22 mmol/L (ref 20–29)
Calcium: 8.8 mg/dL (ref 8.6–10.2)
Chloride: 99 mmol/L (ref 96–106)
Creatinine, Ser: 0.98 mg/dL (ref 0.76–1.27)
Globulin, Total: 2.2 g/dL (ref 1.5–4.5)
Glucose: 71 mg/dL (ref 65–99)
Potassium: 4.8 mmol/L (ref 3.5–5.2)
Sodium: 137 mmol/L (ref 134–144)
Total Protein: 6.2 g/dL (ref 6.0–8.5)
eGFR: 88 mL/min/{1.73_m2} (ref >59)

## 2021-04-12 LAB — CBC
Hematocrit: 40.2 % (ref 37.5–51.0)
Hemoglobin: 14 g/dL (ref 13.0–17.7)
MCH: 29.9 pg (ref 26.6–33.0)
MCHC: 34.8 g/dL (ref 31.5–35.7)
MCV: 86 fL (ref 79–97)
Platelets: 395 10*3/uL (ref 150–450)
RBC: 4.68 x10E6/uL (ref 4.14–5.80)
RDW: 12.2 % (ref 11.6–15.4)
WBC: 6.3 10*3/uL (ref 3.4–10.8)

## 2021-04-12 LAB — HEMOGLOBIN A1C
Est. average glucose Bld gHb Est-mCnc: 134 mg/dL
Hgb A1c MFr Bld: 6.3 % — ABNORMAL HIGH (ref 4.8–5.6)

## 2021-06-18 ENCOUNTER — Other Ambulatory Visit: Payer: Self-pay | Admitting: Interventional Cardiology

## 2021-06-18 DIAGNOSIS — I2510 Atherosclerotic heart disease of native coronary artery without angina pectoris: Secondary | ICD-10-CM

## 2021-06-18 DIAGNOSIS — I10 Essential (primary) hypertension: Secondary | ICD-10-CM

## 2021-07-05 IMAGING — CR DG CHEST 2V
2 series · 2 of 2 positions shown · non-contrast
Comparison: None.

CLINICAL DATA: Chest and abdominal pain

EXAM:
CHEST - 2 VIEW

[chest pa]
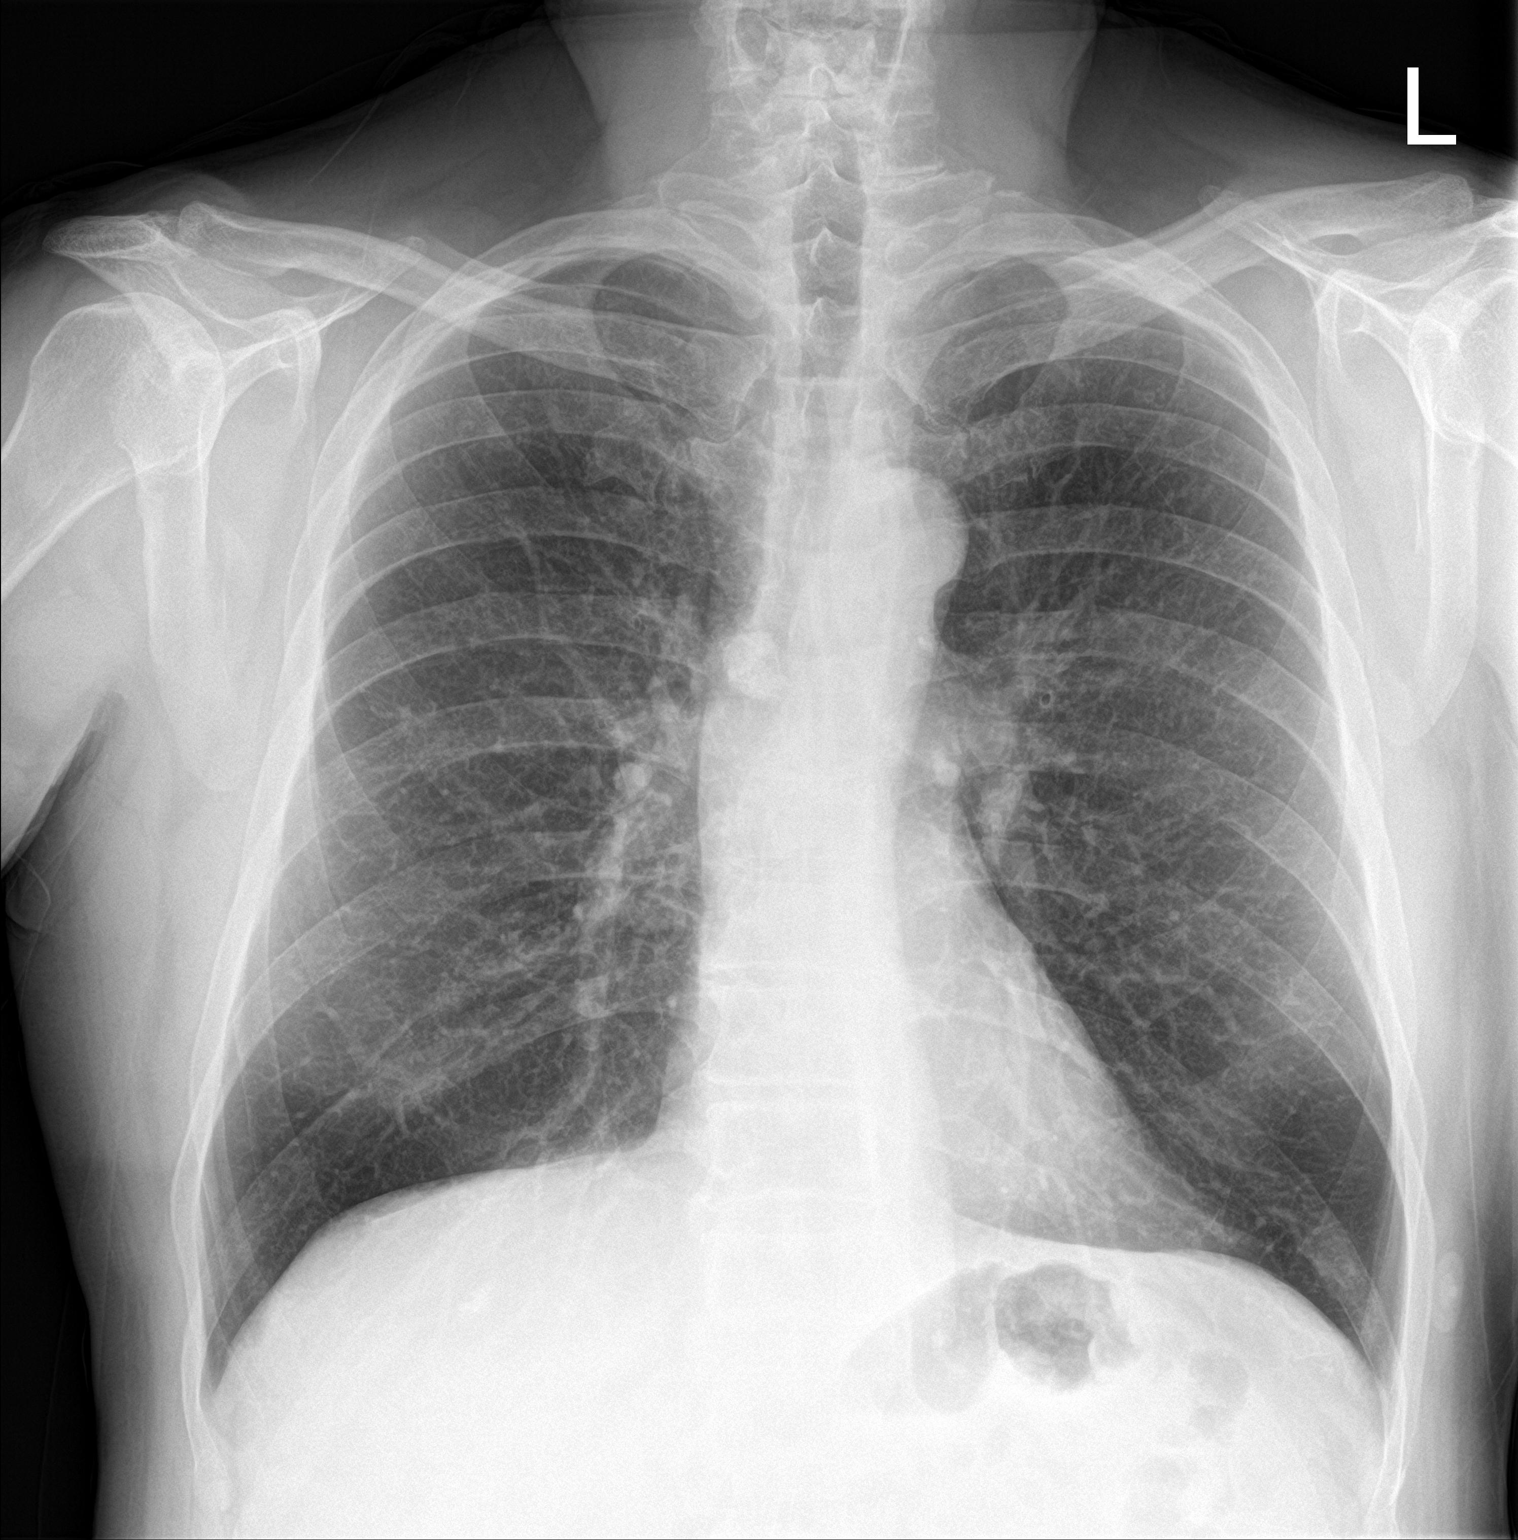

[chest lat]
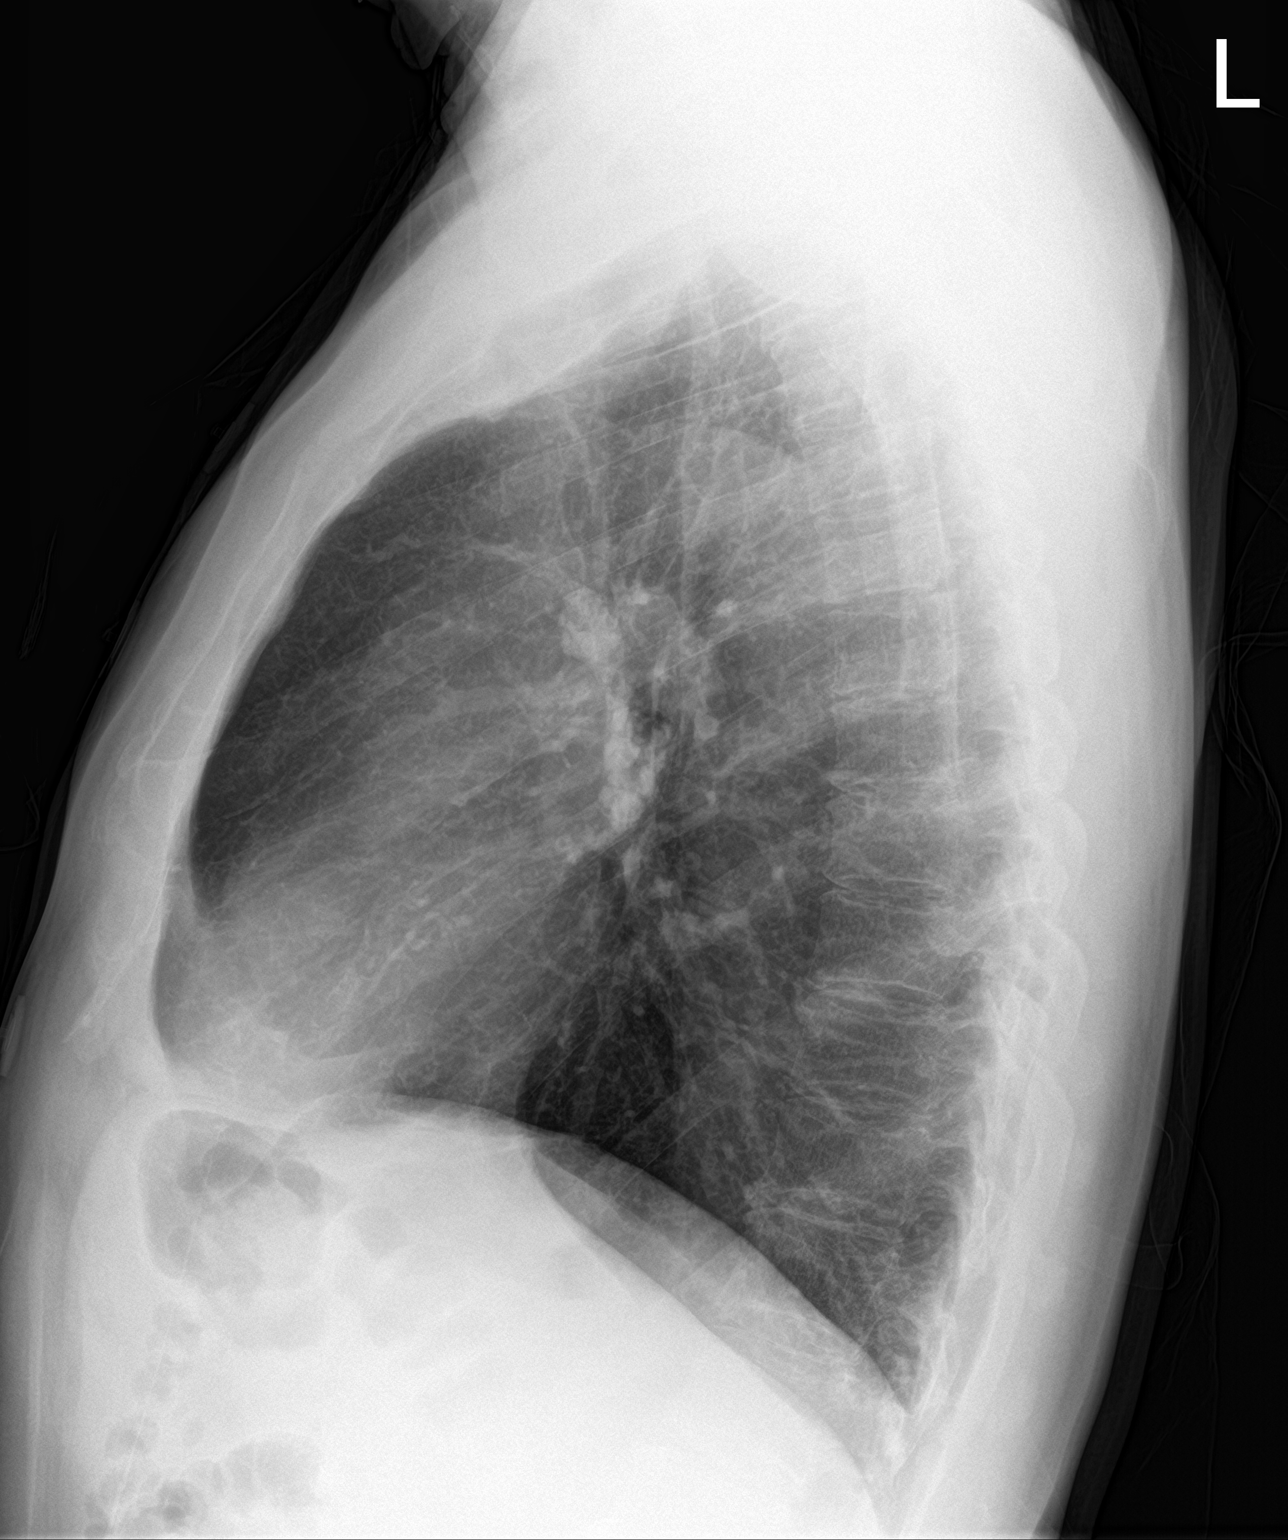

[2 of 2 positions shown; findings below may reference images not displayed]

FINDINGS: The heart size and mediastinal contours are within normal limits.
Both lungs are clear. The visualized skeletal structures are
unremarkable. Calcified hilar nodes suggesting prior granulomatous
disease.
IMPRESSION: No active cardiopulmonary disease.

## 2021-07-15 ENCOUNTER — Encounter: Payer: Self-pay | Admitting: Interventional Cardiology

## 2021-07-21 ENCOUNTER — Other Ambulatory Visit: Payer: Self-pay | Admitting: Interventional Cardiology

## 2021-11-08 ENCOUNTER — Telehealth: Payer: Self-pay

## 2021-11-08 NOTE — Telephone Encounter (Signed)
Last OV 2021. ?Wife Dewayne Hatch will notify pt that appt needed with PCP. ?

## 2021-12-16 ENCOUNTER — Ambulatory Visit (INDEPENDENT_AMBULATORY_CARE_PROVIDER_SITE_OTHER): Payer: Managed Care, Other (non HMO) | Admitting: Internal Medicine

## 2021-12-16 ENCOUNTER — Encounter: Payer: Self-pay | Admitting: Internal Medicine

## 2021-12-16 VITALS — BP 118/84 | HR 64 | Temp 98.2°F | Ht 70.5 in | Wt 218.3 lb

## 2021-12-16 DIAGNOSIS — Z125 Encounter for screening for malignant neoplasm of prostate: Secondary | ICD-10-CM | POA: Diagnosis not present

## 2021-12-16 DIAGNOSIS — Z122 Encounter for screening for malignant neoplasm of respiratory organs: Secondary | ICD-10-CM | POA: Diagnosis not present

## 2021-12-16 DIAGNOSIS — Z720 Tobacco use: Secondary | ICD-10-CM

## 2021-12-16 DIAGNOSIS — E782 Mixed hyperlipidemia: Secondary | ICD-10-CM | POA: Diagnosis not present

## 2021-12-16 DIAGNOSIS — I2510 Atherosclerotic heart disease of native coronary artery without angina pectoris: Secondary | ICD-10-CM

## 2021-12-16 DIAGNOSIS — I10 Essential (primary) hypertension: Secondary | ICD-10-CM | POA: Diagnosis not present

## 2021-12-16 DIAGNOSIS — Z1211 Encounter for screening for malignant neoplasm of colon: Secondary | ICD-10-CM | POA: Diagnosis not present

## 2021-12-16 DIAGNOSIS — Z0000 Encounter for general adult medical examination without abnormal findings: Secondary | ICD-10-CM

## 2021-12-16 LAB — CBC WITH DIFFERENTIAL/PLATELET
Basophils Absolute: 0.1 10*3/uL (ref 0.0–0.1)
Basophils Relative: 1.2 % (ref 0.0–3.0)
Eosinophils Absolute: 0.3 10*3/uL (ref 0.0–0.7)
Eosinophils Relative: 4.6 % (ref 0.0–5.0)
HCT: 42.6 % (ref 39.0–52.0)
Hemoglobin: 14.3 g/dL (ref 13.0–17.0)
Lymphocytes Relative: 28.9 % (ref 12.0–46.0)
Lymphs Abs: 2 10*3/uL (ref 0.7–4.0)
MCHC: 33.7 g/dL (ref 30.0–36.0)
MCV: 88.7 fl (ref 78.0–100.0)
Monocytes Absolute: 0.6 10*3/uL (ref 0.1–1.0)
Monocytes Relative: 8.3 % (ref 3.0–12.0)
Neutro Abs: 4 10*3/uL (ref 1.4–7.7)
Neutrophils Relative %: 57 % (ref 43.0–77.0)
Platelets: 276 10*3/uL (ref 150.0–400.0)
RBC: 4.8 Mil/uL (ref 4.22–5.81)
RDW: 13.4 % (ref 11.5–15.5)
WBC: 7 10*3/uL (ref 4.0–10.5)

## 2021-12-16 LAB — LIPID PANEL
Cholesterol: 98 mg/dL (ref 0–200)
HDL: 37.2 mg/dL — ABNORMAL LOW (ref >39.00)
LDL Cholesterol: 42 mg/dL (ref 0–99)
NonHDL: 61.09
Total CHOL/HDL Ratio: 3
Triglycerides: 94 mg/dL (ref 0.0–149.0)
VLDL: 18.8 mg/dL (ref 0.0–40.0)

## 2021-12-16 LAB — COMPREHENSIVE METABOLIC PANEL
ALT: 35 U/L (ref 0–53)
AST: 29 U/L (ref 0–37)
Albumin: 4.3 g/dL (ref 3.5–5.2)
Alkaline Phosphatase: 82 U/L (ref 39–117)
BUN: 15 mg/dL (ref 6–23)
CO2: 28 mEq/L (ref 19–32)
Calcium: 8.9 mg/dL (ref 8.4–10.5)
Chloride: 105 mEq/L (ref 96–112)
Creatinine, Ser: 1.06 mg/dL (ref 0.40–1.50)
GFR: 75.71 mL/min (ref >60.00)
Glucose, Bld: 106 mg/dL — ABNORMAL HIGH (ref 70–99)
Potassium: 5.2 mEq/L — ABNORMAL HIGH (ref 3.5–5.1)
Sodium: 139 mEq/L (ref 135–145)
Total Bilirubin: 0.9 mg/dL (ref 0.2–1.2)
Total Protein: 6.7 g/dL (ref 6.0–8.3)

## 2021-12-16 LAB — PSA: PSA: 0.63 ng/mL (ref 0.10–4.00)

## 2021-12-16 LAB — HEMOGLOBIN A1C: Hgb A1c MFr Bld: 6 % (ref 4.6–6.5)

## 2021-12-16 NOTE — Progress Notes (Signed)
? ? ? ?Established Patient Office Visit ? ? ? ? ?CC/Reason for Visit: Annual preventive exam ? ?HPI: Matthew Stanley is a 61 y.o. male who is coming in today for the above mentioned reasons. Past Medical History is significant for: Coronary artery disease with non-ST elevated MI in November 2020 followed by Dr. Scarlette Calico.  He has a history of hypertension with a well-documented element of whitecoat syndrome, hyperlipidemia and nicotine dependence who quit smoking in 2020 after his heart attack.  He is overdue for eye and dental care.  Overdue for colonCancer screening.  He is overdue for flu and COVID vaccinations. ? ? ?Past Medical/Surgical History: ?Past Medical History:  ?Diagnosis Date  ? CAD in native artery 06/30/2019  ? HLD (hyperlipidemia) 06/30/2019  ? HTN (hypertension) - new dx 06/28/2019  ? NSTEMI (non-ST elevated myocardial infarction) ([place]) 06/28/2019  ? S/P angioplasty with stent 06/29/19 DES to LCX OM  06/30/2019  ? Shingles 2019  ? Tobacco use 06/28/2019  ? ? ?Past Surgical History:  ?Procedure Laterality Date  ? CORONARY STENT INTERVENTION N/A 06/29/2019  ? Procedure: CORONARY STENT INTERVENTION;  Surgeon: Troy Sine, MD;  Location: [place] [facility];  Service: Cardiovascular;  Laterality: N/A;  ? CORONARY THROMBECTOMY N/A 06/29/2019  ? Procedure: Coronary Thrombectomy;  Surgeon: Troy Sine, MD;  Location: [place] [facility];  Service: Cardiovascular;  Laterality: N/A;  ? LEFT HEART CATH AND CORONARY ANGIOGRAPHY N/A 06/29/2019  ? Procedure: LEFT HEART CATH AND CORONARY ANGIOGRAPHY;  Surgeon: Troy Sine, MD;  Location: [place] [facility];  Service: Cardiovascular;  Laterality: N/A;  ? None    ? ? ?Social History: ? reports that he quit smoking about 2 years ago. His smoking use included cigarettes. He has a 40.00 pack-year smoking history. He has never used smokeless tobacco. He reports current alcohol use of about 1.0 standard drink per week. He reports that he does not use  drugs. ? ?Allergies: ?No Known Allergies ? ?Family History:  ?Family History  ?Problem Relation Age of Onset  ? Stroke Mother   ?     died in his 41s  ? Heart Problems Father   ?     Died in her 70s  ? ? ? ?Current Outpatient Medications:  ?  aspirin EC 81 MG tablet, Take 1 tablet (81 mg total) by mouth daily., Disp: 90 tablet, Rfl: 3 ?  atorvastatin (LIPITOR) 80 MG tablet, TAKE 1 TABLET (80 MG TOTAL) BY MOUTH DAILY AT 6 PM., Disp: 90 tablet, Rfl: 3 ?  carvedilol (COREG) 12.5 MG tablet, TAKE 1 TABLET (12.5 MG TOTAL) BY MOUTH 2 (TWO) TIMES DAILY WITH A MEAL., Disp: 180 tablet, Rfl: 2 ?  clopidogrel (PLAVIX) 75 MG tablet, TAKE 1 TABLET BY MOUTH EVERY DAY, Disp: 90 tablet, Rfl: 3 ?  nitroGLYCERIN (NITROSTAT) 0.4 MG SL tablet, PLACE 1 TABLET (0.4 MG TOTAL) UNDER THE TONGUE EVERY 5 MINUTES X 3 DOSES AS NEEDED FOR CHEST PAIN., Disp: 75 tablet, Rfl: 1 ? ?Review of Systems:  ?Constitutional: Denies fever, chills, diaphoresis, appetite change and fatigue.  ?HEENT: Denies photophobia, eye pain, redness, hearing loss, ear pain, congestion, sore throat, rhinorrhea, sneezing, mouth sores, trouble swallowing, neck pain, neck stiffness and tinnitus.   ?Respiratory: Denies SOB, DOE, cough, chest tightness,  and wheezing.   ?Cardiovascular: Denies chest pain, palpitations and leg swelling.  ?Gastrointestinal: Denies nausea, vomiting, abdominal pain, diarrhea, constipation, blood in stool and abdominal distention.  ?Genitourinary: Denies dysuria, urgency, frequency, hematuria, flank  pain and difficulty urinating.  ?Endocrine: Denies: hot or cold intolerance, sweats, changes in hair or nails, polyuria, polydipsia. ?Musculoskeletal: Denies myalgias, back pain, joint swelling, arthralgias and gait problem.  ?Skin: Denies pallor, rash and wound.  ?Neurological: Denies dizziness, seizures, syncope, weakness, light-headedness, numbness and headaches.  ?Hematological: Denies adenopathy. Easy bruising, personal or family bleeding history   ?Psychiatric/Behavioral: Denies suicidal ideation, mood changes, confusion, nervousness, sleep disturbance and agitation ? ? ? ?Physical Exam: ?Vitals:  ? 12/16/21 0721 12/16/21 0753  ?BP: (!) 150/100 118/84  ?Pulse: 64   ?Temp: 98.2 ?F (36.8 ?C)   ?TempSrc: Oral   ?SpO2: 98%   ?Weight: 218 lb 4.8 oz (99 kg)   ?Height: 5' 10.5" (1.791 m)   ? ? ?Body mass index is 30.88 kg/m?. ? ? ?Constitutional: NAD, calm, comfortable ?Eyes: PERRL, lids and conjunctivae normal ?ENMT: Mucous membranes are moist. Posterior pharynx clear of any exudate or lesions.  Poor dentition. Tympanic membrane is pearly white, no erythema or bulging. ?Neck: normal, supple, no masses, no thyromegaly ?Respiratory: clear to auscultation bilaterally, no wheezing, no crackles. Normal respiratory effort. No accessory muscle use.  ?Cardiovascular: Regular rate and rhythm, no murmurs / rubs / gallops. No extremity edema. 2+ pedal pulses. No carotid bruits.  ?Abdomen: no tenderness, no masses palpated. No hepatosplenomegaly. Bowel sounds positive.  ?Musculoskeletal: no clubbing / cyanosis. No joint deformity upper and lower extremities. Good ROM, no contractures. Normal muscle tone.  ?Skin: no rashes, lesions, ulcers. No induration ?Neurologic: CN 2-12 grossly intact. Sensation intact, DTR normal. Strength 5/5 in all 4.  ?Psychiatric: Normal judgment and insight. Alert and oriented x 3. Normal mood.  ? ? ?[place] Office Visit from 12/16/2021 in [place] at [place]  ?PHQ-9 Total Score 0  ? ?  ? ? ? ?Impression and Plan: ? ?Encounter for preventive health examination ?-Recommend routine eye and dental care. ?-Immunizations: Declines COVID and flu despite counseling, other immunizations are up-to-date ?-Healthy lifestyle discussed in detail. ?-Labs to be updated today. ?-Colon cancer screening: Cologuard ?-Breast cancer screening: Not applicable ?-Cervical cancer screening: Not applicable ?-Lung cancer screening: Referred for low-dose CT  scan ?-Prostate cancer screening: PSA ?-DEXA: Not applicable ? ?Colon cancer screening ?-Has declined colonoscopy but will agree to Cologuard. ? ?Prostate cancer screening - ? Plan: PSA ? ?Screening for lung cancer ?-Referred for lung cancer screening. ? ?CAD in native artery ?-Stable, no chest pain, followed by cardiology. ? ?Mixed hyperlipidemia  ?- Plan: Lipid panel ? ?Primary hypertension  ?- Plan: CBC with Differential/Platelet, Comprehensive metabolic panel, Hemoglobin A1c ?-As usual, strong element of whitecoat syndrome with in office blood pressure of 150/100, blood pressure this morning at home was 118/84. ? ?Tobacco use ?-Quit smoking in 2020 ? ? ? ? ? ? ?Patient Instructions  ?-Nice seeing you today!! ? ?-Lab work today; will notify you once results are available. ? ?-Consider COVID bivalent vaccine. ? ?-Remember eye and dental exams. ? ?-Will arrange for cologuard and lung CT. ? ?-Schedule follow up in 6 months. ? ? ? ? ? ?Lelon Frohlich, MD ?[facility] ? ? ?

## 2021-12-16 NOTE — Patient Instructions (Signed)
-  Nice seeing you today!! ? ?-Lab work today; will notify you once results are available. ? ?-Consider COVID bivalent vaccine. ? ?-Remember eye and dental exams. ? ?-Will arrange for cologuard and lung CT. ? ?-Schedule follow up in 6 months. ? ? ?

## 2021-12-17 ENCOUNTER — Encounter: Payer: Self-pay | Admitting: Internal Medicine

## 2021-12-17 ENCOUNTER — Other Ambulatory Visit: Payer: Self-pay

## 2021-12-17 DIAGNOSIS — R7302 Impaired glucose tolerance (oral): Secondary | ICD-10-CM | POA: Insufficient documentation

## 2021-12-17 DIAGNOSIS — R7303 Prediabetes: Secondary | ICD-10-CM

## 2021-12-17 DIAGNOSIS — E875 Hyperkalemia: Secondary | ICD-10-CM

## 2021-12-26 LAB — COLOGUARD

## 2021-12-31 ENCOUNTER — Other Ambulatory Visit (INDEPENDENT_AMBULATORY_CARE_PROVIDER_SITE_OTHER): Payer: Managed Care, Other (non HMO)

## 2021-12-31 DIAGNOSIS — R7303 Prediabetes: Secondary | ICD-10-CM | POA: Diagnosis not present

## 2021-12-31 DIAGNOSIS — E875 Hyperkalemia: Secondary | ICD-10-CM

## 2021-12-31 LAB — BASIC METABOLIC PANEL
BUN: 12 mg/dL (ref 6–23)
CO2: 27 mEq/L (ref 19–32)
Calcium: 9 mg/dL (ref 8.4–10.5)
Chloride: 102 mEq/L (ref 96–112)
Creatinine, Ser: 1.07 mg/dL (ref 0.40–1.50)
GFR: 74.84 mL/min (ref >60.00)
Glucose, Bld: 114 mg/dL — ABNORMAL HIGH (ref 70–99)
Potassium: 4.1 mEq/L (ref 3.5–5.1)
Sodium: 135 mEq/L (ref 135–145)

## 2021-12-31 LAB — HEMOGLOBIN A1C: Hgb A1c MFr Bld: 6 % (ref 4.6–6.5)

## 2022-01-20 LAB — COLOGUARD: COLOGUARD: NEGATIVE

## 2022-01-21 ENCOUNTER — Other Ambulatory Visit: Payer: Self-pay | Admitting: Interventional Cardiology

## 2022-02-28 ENCOUNTER — Encounter: Payer: Self-pay | Admitting: Interventional Cardiology

## 2022-03-20 ENCOUNTER — Other Ambulatory Visit: Payer: Self-pay | Admitting: Interventional Cardiology

## 2022-03-20 DIAGNOSIS — I10 Essential (primary) hypertension: Secondary | ICD-10-CM

## 2022-03-20 DIAGNOSIS — I2510 Atherosclerotic heart disease of native coronary artery without angina pectoris: Secondary | ICD-10-CM

## 2022-04-07 ENCOUNTER — Encounter: Payer: Self-pay | Admitting: Interventional Cardiology

## 2022-04-18 ENCOUNTER — Other Ambulatory Visit: Payer: Self-pay | Admitting: Interventional Cardiology

## 2022-04-29 NOTE — Progress Notes (Unsigned)
Cardiology Office Note   Date:  05/01/2022   ID:  Matthew Stanley, Matthew Stanley 10/19/1960, MRN [id number]  PCP:  Isaac Bliss, Rayford Halsted, MD    No chief complaint on file.  CAD  Wt Readings from Last 3 Encounters:  05/01/22 215 lb (97.5 kg)  12/16/21 218 lb 4.8 oz (99 kg)  04/11/21 217 lb 9.6 oz (98.7 kg)       History of Present Illness: Matthew Stanley is a 61 y.o. male   With CAD.  Cath in 11/20 showed: "Cardiac cath 06/29/19 1st Mrg lesion is 95% stenosed. Mid Cx lesion is 20% stenosed. Dist Cx lesion is 30% stenosed. 2nd Diag lesion is 80% stenosed. Mid LAD lesion is 40% stenosed. Post intervention, there is a 0% residual stenosis. Prox RCA lesion is 20% stenosed. A stent was successfully placed.   The LAD has mild irregularity proximally with 40% narrowing.  The second diagonal vessel is very small caliber and had 80% proximal stenosis in the vessel less than 1.5 mm.    Left circumflex vessel is a dominant vessel with subtotal ostial to proximal obtuse marginal stenosis representing the culprit lesion in the patient's ACS/non-STEMI.  There is thrombus in the AV groove circumflex with 20% narrowing, and 30% mid distal AV groove stenosis.   Small nondominant RCA with mild 20% narrowing in the region of the more marginal marginal takeoff.   Preserved global LV contractility with a subtle region of mid anterolateral hypocontractility.  LVEDP 10 mm.   Difficult but successful percutaneous coronary intervention involving the circumflex marginal vessel treated with PTCA, toe thrombectomy, with subsequent proximal marginal dissection successfully treated with ultimate stenting utilizing a 2.25 x 38 mm Resolute Onyx stent with the stenoses being reduced to 0%.   RECOMMENDATION: DAPT for minimum of 1 year.  Smoking cessation is imperative.  Aggressive lipid-lowering therapy high potency statin therapy and with target LDL less than 80%. "   Noted in early 2022: "He has  continued to avoid tobacco.  BP at home are typically in the 559-741 range systolic.  Higher in the MDs office.    2023: He is trying to eat healthy.  He limits salt intake.    Increased walking of late.  No smoking still since 2020.   Denies : Chest pain. Dizziness. Leg edema. Nitroglycerin use. Orthopnea. Palpitations. Paroxysmal nocturnal dyspnea. Shortness of breath. Syncope.      Past Medical History:  Diagnosis Date   CAD in native artery 06/30/2019   HLD (hyperlipidemia) 06/30/2019   HTN (hypertension) - new dx 06/28/2019   NSTEMI (non-ST elevated myocardial infarction) ([place]) 06/28/2019   S/P angioplasty with stent 06/29/19 DES to LCX OM  06/30/2019   Shingles 2019   Tobacco use 06/28/2019    Past Surgical History:  Procedure Laterality Date   CORONARY STENT INTERVENTION N/A 06/29/2019   Procedure: CORONARY STENT INTERVENTION;  Surgeon: Troy Sine, MD;  Location: [facility];  Service: Cardiovascular;  Laterality: N/A;   CORONARY THROMBECTOMY N/A 06/29/2019   Procedure: Coronary Thrombectomy;  Surgeon: Troy Sine, MD;  Location: [facility];  Service: Cardiovascular;  Laterality: N/A;   LEFT HEART CATH AND CORONARY ANGIOGRAPHY N/A 06/29/2019   Procedure: LEFT HEART CATH AND CORONARY ANGIOGRAPHY;  Surgeon: Troy Sine, MD;  Location: [facility];  Service: Cardiovascular;  Laterality: N/A;   None       Current Outpatient Medications  Medication Sig Dispense Refill  aspirin EC 81 MG tablet Take 1 tablet (81 mg total) by mouth daily. 90 tablet 3   atorvastatin (LIPITOR) 80 MG tablet TAKE 1 TABLET BY MOUTH DAILY AT 6 PM. 90 tablet 0   carvedilol (COREG) 12.5 MG tablet TAKE 1 TABLET (12.5MG  TOTAL) BY MOUTH TWICE A DAY WITH MEALS 180 tablet 0   clopidogrel (PLAVIX) 75 MG tablet TAKE 1 TABLET BY MOUTH EVERY DAY 90 tablet 3   nitroGLYCERIN (NITROSTAT) 0.4 MG SL tablet PLACE 1 TABLET (0.4 MG TOTAL) UNDER THE TONGUE EVERY 5 MINUTES X 3 DOSES AS  NEEDED FOR CHEST PAIN. 75 tablet 1   No current facility-administered medications for this visit.    Allergies:   Patient has no known allergies.    Social History:  The patient  reports that he quit smoking about 2 years ago. His smoking use included cigarettes. He has a 40.00 pack-year smoking history. He has never used smokeless tobacco. He reports current alcohol use of about 1.0 standard drink of alcohol per week. He reports that he does not use drugs.   Family History:  The patient's family history includes Heart Problems in his father; Stroke in his mother.    ROS:  Please see the history of present illness.   Otherwise, review of systems are positive for some dietary indiscretion.   All other systems are reviewed and negative.    PHYSICAL EXAM: VS:  BP (!) 132/90   Pulse 62   Ht 5\' 11"  (1.803 m)   Wt 215 lb (97.5 kg)   SpO2 97%   BMI 29.99 kg/m  , BMI Body mass index is 29.99 kg/m. GEN: Well nourished, well developed, in no acute distress HEENT: normal Neck: no JVD, carotid bruits, or masses Cardiac: RRR; no murmurs, rubs, or gallops,no edema  Respiratory:  clear to auscultation bilaterally, normal work of breathing GI: soft, nontender, nondistended, + BS MS: no deformity or atrophy Skin: warm and dry, no rash Neuro:  Strength and sensation are intact Psych: euthymic mood, full affect   EKG:   The ekg ordered today demonstrates normal ECG   Recent Labs: 12/16/2021: ALT 35; Hemoglobin 14.3; Platelets 276.0 12/31/2021: BUN 12; Creatinine, Ser 1.07; Potassium 4.1; Sodium 135   Lipid Panel    Component Value Date/Time   CHOL 98 12/16/2021 0802   CHOL 96 (L) 04/11/2021 1038   TRIG 94.0 12/16/2021 0802   HDL 37.20 (L) 12/16/2021 0802   HDL 34 (L) 04/11/2021 1038   CHOLHDL 3 12/16/2021 0802   VLDL 18.8 12/16/2021 0802   LDLCALC 42 12/16/2021 0802   LDLCALC 46 04/11/2021 1038     Other studies Reviewed: Additional studies/ records that were reviewed today with  results demonstrating: labs reviewed.   ASSESSMENT AND PLAN:  CAD/Old MI: No angina.  Continue current medical therapy.  Continue monitoring blood pressure at home.Continue healthy lifestyle with regular exercise and healthy diet.   Whole food plant-based diet.  High-fiber diet. Avoid processed food. HTN: Based on home readings. The current medical regimen is effective;  continue present plan and medications.  Home readings are in the 1101-130 systolic. Tobacco abuse: Quit smoking several years ago.  Hyperlipideima: Total cholesterol 98 HDL 37 LDL 42 triglycerides 94 in 5/23. PreDM: A1C 6.0.   Current medicines are reviewed at length with the patient today.  The patient concerns regarding his medicines were addressed.  The following changes have been made:  No change  Labs/ tests ordered today include:  No orders of  the defined types were placed in this encounter.   Recommend 150 minutes/week of aerobic exercise Low fat, low carb, high fiber diet recommended  Disposition:   FU in 1 year   Signed, Lance Muss, MD  05/01/2022 9:37 AM    [facility] [address] [place], [place], [place]  [address] Phone: [phone]; Fax: [phone]

## 2022-05-01 ENCOUNTER — Ambulatory Visit: Payer: Managed Care, Other (non HMO) | Attending: Interventional Cardiology | Admitting: Interventional Cardiology

## 2022-05-01 ENCOUNTER — Encounter: Payer: Self-pay | Admitting: Interventional Cardiology

## 2022-05-01 VITALS — BP 132/90 | HR 62 | Ht 71.0 in | Wt 215.0 lb

## 2022-05-01 DIAGNOSIS — R7303 Prediabetes: Secondary | ICD-10-CM

## 2022-05-01 DIAGNOSIS — I252 Old myocardial infarction: Secondary | ICD-10-CM | POA: Diagnosis not present

## 2022-05-01 DIAGNOSIS — I10 Essential (primary) hypertension: Secondary | ICD-10-CM | POA: Diagnosis not present

## 2022-05-01 DIAGNOSIS — I25118 Atherosclerotic heart disease of native coronary artery with other forms of angina pectoris: Secondary | ICD-10-CM | POA: Diagnosis not present

## 2022-05-01 DIAGNOSIS — E782 Mixed hyperlipidemia: Secondary | ICD-10-CM | POA: Diagnosis not present

## 2022-05-01 NOTE — Patient Instructions (Signed)
Medication Instructions:  Your physician recommends that you continue on your current medications as directed. Please refer to the Current Medication list given to you today.  *If you need a refill on your cardiac medications before your next appointment, please call your pharmacy*   Follow-Up: At [facility] you and your health needs are our priority.  As part of our continuing mission to provide you with exceptional heart care, we have created designated Provider Care Teams.  These Care Teams include your primary Cardiologist (physician) and Advanced Practice Providers (APPs -  Physician Assistants and Nurse Practitioners) who all work together to provide you with the care you need, when you need it.  We recommend signing up for the patient portal called "MyChart".  Sign up information is provided on this After Visit Summary.  MyChart is used to connect with patients for Virtual Visits (Telemedicine).  Patients are able to view lab/test results, encounter notes, upcoming appointments, etc.  Non-urgent messages can be sent to your provider as well.   To learn more about what you can do with MyChart, go to [email or web address].    Your next appointment:   1 year(s)  The format for your next appointment:   In Person  Provider:   Larae Grooms, MD

## 2022-06-14 ENCOUNTER — Other Ambulatory Visit: Payer: Self-pay | Admitting: Interventional Cardiology

## 2022-06-14 DIAGNOSIS — I2510 Atherosclerotic heart disease of native coronary artery without angina pectoris: Secondary | ICD-10-CM

## 2022-06-14 DIAGNOSIS — I10 Essential (primary) hypertension: Secondary | ICD-10-CM

## 2022-06-19 ENCOUNTER — Other Ambulatory Visit: Payer: Managed Care, Other (non HMO)

## 2022-06-23 ENCOUNTER — Other Ambulatory Visit: Payer: Self-pay | Admitting: Interventional Cardiology

## 2022-06-27 ENCOUNTER — Other Ambulatory Visit: Payer: Managed Care, Other (non HMO)

## 2022-07-08 ENCOUNTER — Other Ambulatory Visit: Payer: Self-pay | Admitting: Internal Medicine

## 2022-07-08 ENCOUNTER — Other Ambulatory Visit (INDEPENDENT_AMBULATORY_CARE_PROVIDER_SITE_OTHER): Payer: Managed Care, Other (non HMO)

## 2022-07-08 DIAGNOSIS — R7302 Impaired glucose tolerance (oral): Secondary | ICD-10-CM | POA: Diagnosis not present

## 2022-07-08 LAB — HEMOGLOBIN A1C: Hgb A1c MFr Bld: 6.2 % (ref 4.6–6.5)

## 2022-07-10 ENCOUNTER — Other Ambulatory Visit: Payer: Self-pay | Admitting: *Deleted

## 2022-07-10 DIAGNOSIS — R7302 Impaired glucose tolerance (oral): Secondary | ICD-10-CM

## 2022-07-15 ENCOUNTER — Other Ambulatory Visit: Payer: Self-pay | Admitting: Interventional Cardiology

## 2023-01-06 ENCOUNTER — Other Ambulatory Visit: Payer: Self-pay | Admitting: Interventional Cardiology

## 2023-01-06 DIAGNOSIS — I2510 Atherosclerotic heart disease of native coronary artery without angina pectoris: Secondary | ICD-10-CM

## 2023-01-06 DIAGNOSIS — I10 Essential (primary) hypertension: Secondary | ICD-10-CM

## 2023-03-30 ENCOUNTER — Other Ambulatory Visit: Payer: Self-pay | Admitting: Interventional Cardiology

## 2023-03-30 DIAGNOSIS — I2510 Atherosclerotic heart disease of native coronary artery without angina pectoris: Secondary | ICD-10-CM

## 2023-03-30 DIAGNOSIS — I10 Essential (primary) hypertension: Secondary | ICD-10-CM

## 2023-04-07 ENCOUNTER — Other Ambulatory Visit: Payer: Self-pay | Admitting: Interventional Cardiology

## 2023-04-30 ENCOUNTER — Encounter: Payer: Self-pay | Admitting: Cardiology

## 2023-04-30 ENCOUNTER — Ambulatory Visit: Payer: Managed Care, Other (non HMO) | Attending: Cardiology | Admitting: Cardiology

## 2023-04-30 VITALS — BP 150/90 | HR 67 | Ht 71.0 in | Wt 224.6 lb

## 2023-04-30 DIAGNOSIS — I10 Essential (primary) hypertension: Secondary | ICD-10-CM | POA: Diagnosis not present

## 2023-04-30 DIAGNOSIS — I25118 Atherosclerotic heart disease of native coronary artery with other forms of angina pectoris: Secondary | ICD-10-CM

## 2023-04-30 DIAGNOSIS — L989 Disorder of the skin and subcutaneous tissue, unspecified: Secondary | ICD-10-CM

## 2023-04-30 DIAGNOSIS — E782 Mixed hyperlipidemia: Secondary | ICD-10-CM

## 2023-04-30 DIAGNOSIS — R7303 Prediabetes: Secondary | ICD-10-CM | POA: Diagnosis not present

## 2023-04-30 MED ORDER — ROSUVASTATIN CALCIUM 40 MG PO TABS: 40.0000 mg | ORAL_TABLET | Freq: Every day | ORAL | 3 refills | Status: DC

## 2023-04-30 NOTE — Progress Notes (Signed)
Cardiology Office Note:   Date:  04/30/2023  ID:  Matthew Stanley, DOB 1960-09-03, MRN [id number] PCP: Philip Aspen, [name], MD   HeartCare Providers Cardiologist:  Lance Muss, MD    History of Present Illness:    The patient is a 62 year old individual with a history of coronary artery disease, hyperlipidemia, hypertension, and STEMI with angioplasty and stenting in November of 2020 in the left circumflex and OM. The patient was last seen in September of 2023 and was doing well overall without evidence of angina.  The patient presents with a concern about a persistent skin issue on his hands, which he believes may be a side effect of Atorvastatin. The condition, characterized by redness, peeling, and occasional itching, started approximately a year ago and has been persistent since. The patient has been on the statin for a longer period. He has consulted a dermatologist who suggested an allergic reaction but could not identify the allergen. A prescribed cream has helped with the peeling but not the redness.  Apart from the skin condition, the patient reports no symptoms. He denies chest pain or shortness of breath. He maintains an active lifestyle, walking a little over a mile twice daily without any issues such as dizziness, lightheadedness, or palpitations. The patient has made dietary changes, reducing processed food intake and making adjustments due to help with prediabetes. He has also quit smoking since his heart attack.  The patient's blood pressure readings at home are typically between 100 and 120, although they tend to be higher during clinic visits.  Today patient denies chest pain, shortness of breath, lower extremity edema, fatigue, palpitations, melena, hematuria, hemoptysis, diaphoresis, weakness, presyncope, syncope, orthopnea, and PND.   Studies Reviewed:    EKG:   EKG Interpretation Date/Time:  Thursday April 30 2023 08:53:16 EDT Ventricular  Rate:  66 PR Interval:  184 QRS Duration:  96 QT Interval:  388 QTC Calculation: 406 R Axis:   11  Text Interpretation: Normal sinus rhythm Incomplete right bundle branch block When compared with ECG of 30-Jun-2019 04:09, Nonspecific T wave abnormality no longer evident in Lateral leads Confirmed by Perlie Gold [phone]) on 04/30/2023 9:11:28 AM   Results LABS A1c: 6.1 (04/22/2023) K: 5.4 (04/22/2023) LDL: 42 (12/2021)   Risk Assessment/Calculations:     HYPERTENSION CONTROL Vitals:   04/30/23 0845 04/30/23 0935  BP: (!) 150/98 (!) 150/90    The patient's blood pressure is elevated above target today.  In order to address the patient's elevated BP: The blood pressure is usually elevated in clinic.  Blood pressures monitored at home have been optimal.; Blood pressure will be monitored at home to determine if medication changes need to be made.           Physical Exam:   VS:  BP (!) 150/90   Pulse 67   Ht 5\' 11"  (1.803 m)   Wt 224 lb 9.6 oz (101.9 kg)   SpO2 (!) 7%   BMI 31.33 kg/m    Wt Readings from Last 3 Encounters:  04/30/23 224 lb 9.6 oz (101.9 kg)  05/01/22 215 lb (97.5 kg)  12/16/21 218 lb 4.8 oz (99 kg)     Physical Exam Vitals reviewed.  Constitutional:      Appearance: Normal appearance.  HENT:     Head: Normocephalic.  Eyes:     Pupils: Pupils are equal, round, and reactive to light.  Cardiovascular:     Rate and Rhythm: Normal rate and regular rhythm.  Pulses: Normal pulses.     Heart sounds: Normal heart sounds.  Pulmonary:     Effort: Pulmonary effort is normal.     Breath sounds: Normal breath sounds.  Abdominal:     General: Abdomen is flat.     Palpations: Abdomen is soft.  Musculoskeletal:     Right lower leg: No edema.     Left lower leg: No edema.  Skin:    General: Skin is warm and dry.     Capillary Refill: Capillary refill takes less than 2 seconds.  Neurological:     General: No focal deficit present.     Mental Status:  He is alert and oriented to person, place, and time.  Psychiatric:        Mood and Affect: Mood normal.        Behavior: Behavior normal.        Thought Content: Thought content normal.        Judgment: Judgment normal.      ASSESSMENT AND PLAN:    Dermatologic reaction Persistent redness, peeling, and itching of hands. Patient suspects atorvastatin as the cause. Dermatologist consultation did not identify the cause but provided symptomatic relief with a cream. -Switch Atorvastatin 80mg  to Rosuvastatin 40mg  daily. -If symptoms persist, consider consultation with clinical pharmacist for further evaluation.  Coronary Artery Disease History of STEMI with angioplasty and stenting in left circumflex and OM in November 2020. Currently asymptomatic with good exercise tolerance. On dual antiplatelet therapy with Aspirin and Plavix. -Reach out to Dr. Eldridge Dace to discuss the necessity of continuing dual antiplatelet therapy or switching to Aspirin monotherapy. Continue DAPT for now -Continue statin (transition from Atorvastatin to Crestor) -Check lipid panel  Hyperkalemia Patient with K of 5.4 on recent labs at work. Will repeat BMP today.   Prediabetes Stable HbA1c at 6.1. Patient making dietary modifications. -Encourage continued lifestyle modifications.  Hypertension Elevated office blood pressure readings, but home readings within normal range. Suspected white coat hypertension. -Request patient to send a log of home blood pressure readings in a couple of weeks for monitoring. -Continue Coreg 12.5mg  BID  General Health Maintenance -Order metabolic panel and lipid panel today. -Check potassium level today. -Follow-up in 1 year unless changes in symptoms or ongoing dermatologic issues, in which case, refer to clinical pharmacist.         Signed, Perlie Gold, PA-C

## 2023-04-30 NOTE — Patient Instructions (Signed)
Medication Instructions:  Your physician has recommended you make the following change in your medication:  STOP ATORVASTATIN (LIPITOR). START ROSUVASTATIN (CRESTOR) 40MG  TAKE 1 TABLET BY MOUTH AT BEDTIME  *If you need a refill on your cardiac medications before your next appointment, please call your pharmacy*   Lab Work: BMET AND LIPID TODAY.  If you have labs (blood work) drawn today and your tests are completely normal, you will receive your results only by: MyChart Message (if you have MyChart) OR A paper copy in the mail If you have any lab test that is abnormal or we need to change your treatment, we will call you to review the results.   Testing/Procedures: None   Follow-Up: At [facility], you and your health needs are our priority.  As part of our continuing mission to provide you with exceptional heart care, we have created designated Provider Care Teams.  These Care Teams include your primary Cardiologist (physician) and Advanced Practice Providers (APPs -  Physician Assistants and Nurse Practitioners) who all work together to provide you with the care you need, when you need it.  We recommend signing up for the patient portal called "MyChart".  Sign up information is provided on this After Visit Summary.  MyChart is used to connect with patients for Virtual Visits (Telemedicine).  Patients are able to view lab/test results, encounter notes, upcoming appointments, etc.  Non-urgent messages can be sent to your provider as well.   To learn more about what you can do with MyChart, go to [email or web address].    Your next appointment:   1 year(s)  Provider:   Perlie Gold, PA-C

## 2023-05-01 LAB — BASIC METABOLIC PANEL
BUN/Creatinine Ratio: 12 (ref 10–24)
BUN: 12 mg/dL (ref 8–27)
CO2: 23 mmol/L (ref 20–29)
Calcium: 9 mg/dL (ref 8.6–10.2)
Chloride: 104 mmol/L (ref 96–106)
Creatinine, Ser: 1 mg/dL (ref 0.76–1.27)
Glucose: 84 mg/dL (ref 70–99)
Potassium: 4.5 mmol/L (ref 3.5–5.2)
Sodium: 139 mmol/L (ref 134–144)
eGFR: 85 mL/min/{1.73_m2} (ref >59)

## 2023-05-01 LAB — LIPID PANEL
Chol/HDL Ratio: 2.5 ratio (ref 0.0–5.0)
Cholesterol, Total: 90 mg/dL — ABNORMAL LOW (ref 100–199)
HDL: 36 mg/dL — ABNORMAL LOW (ref >39)
LDL Chol Calc (NIH): 36 mg/dL (ref 0–99)
Triglycerides: 93 mg/dL (ref 0–149)
VLDL Cholesterol Cal: 18 mg/dL (ref 5–40)

## 2023-05-03 NOTE — Progress Notes (Signed)
After discussion with Dr. Eldridge Dace, we will plan to discontinue ASA and continue with Plavix monotherapy given residual moderate CAD.   Perlie Gold, PA-C

## 2023-05-04 ENCOUNTER — Telehealth: Payer: Self-pay | Admitting: *Deleted

## 2023-05-04 NOTE — Telephone Encounter (Signed)
-----   Message from Perlie Gold sent at 05/02/2023  5:07 PM EDT ----- Regarding: Med change Per Dr. Eldridge Dace, can stop ASA and continue with Plavix only.  Perlie Gold, PA-C ----- Message ----- From: Corky Crafts, MD Sent: 04/30/2023  11:26 PM EDT To: Perlie Gold, PA-C  He has some other moderate disease from prior cath so I would stop aspirin and continue clopidogrel monotherapy ----- Message ----- From: Perlie Gold, PA-C Sent: 04/30/2023  12:34 PM EDT To: Corky Crafts, MD  Hey Dr. Eldridge Dace,  I just saw your patient today. He has a history of STEMI with angioplasty and stenting in left circumflex and OM in November 2020. Currently asymptomatic with good exercise tolerance. On dual antiplatelet therapy with Aspirin and Plavix. Do we still need to continue DAPT?  Clayburn Pert

## 2023-05-04 NOTE — Telephone Encounter (Signed)
Left message for patient to call back  

## 2023-05-12 MED ORDER — CLOPIDOGREL BISULFATE 75 MG PO TABS: 75.0000 mg | ORAL_TABLET | Freq: Every day | ORAL | 3 refills | Status: DC

## 2023-05-12 NOTE — Telephone Encounter (Signed)
Spoke w patient and advised to stop aspirin and continue Plavix.  Updated Plavix prescription sent to [facility].

## 2023-06-16 DIAGNOSIS — E782 Mixed hyperlipidemia: Secondary | ICD-10-CM

## 2023-06-24 ENCOUNTER — Other Ambulatory Visit: Payer: Self-pay | Admitting: Interventional Cardiology

## 2023-06-24 DIAGNOSIS — I10 Essential (primary) hypertension: Secondary | ICD-10-CM

## 2023-06-24 DIAGNOSIS — I2510 Atherosclerotic heart disease of native coronary artery without angina pectoris: Secondary | ICD-10-CM

## 2023-07-23 ENCOUNTER — Ambulatory Visit: Payer: Managed Care, Other (non HMO) | Attending: Cardiology | Admitting: Pharmacist

## 2023-07-23 DIAGNOSIS — I2510 Atherosclerotic heart disease of native coronary artery without angina pectoris: Secondary | ICD-10-CM | POA: Diagnosis not present

## 2023-07-23 DIAGNOSIS — E782 Mixed hyperlipidemia: Secondary | ICD-10-CM

## 2023-07-23 NOTE — Progress Notes (Signed)
Patient ID: Matthew Stanley                 DOB: 12/04/1960                    MRN: [id number]      HPI: Matthew Stanley is a 62 y.o. male patient referred to lipid clinic by Matthew Gold, PA. PMH is significant for coronary artery disease, hyperlipidemia, hypertension, and STEMI with angioplasty and stenting in November of 2020 in the left circumflex and OM.   Seen by Matthew Stanley in Sept with concern about a persistent skin issue on his hands, which he believed may have been a side effect of Atorvastatin. The condition, characterized by redness, peeling, and occasional itching, started approximately a year ago and has been persistent since. The patient has been on the statin for a longer period. He has consulted a dermatologist who suggested an allergic reaction but could not identify the allergen. A prescribed cream has helped with the peeling but not the redness. Atorvastatin was switched to rosuvastatin but the issue continued. Patient stopped rosuvastatin and his hands got better. When he resumed rosuvastatin the issue came back. Patient repeated this several times.  Patient presents today to lipid clinic.  He expresses concern over why everyone only talks about his cholesterol when it wasn't even that high before his heart attack. Tried to explain that cholesterol is just one risk factor and we try to target them all. Attempted to discuss his diet and exercise but patient resistant to any change. He has concerns over his A1C and states that our statin increased his blood sugar. However, when I recommended he cut back on sugary drinks such as his daily regular coke and green tea- patient appeared displeased.  Reviewed options for lowering LDL cholesterol, including ezetimibe, PCSK-9 inhibitors, bempedoic acid and inclisiran.  Discussed mechanisms of action, dosing, side effects and potential decreases in LDL cholesterol.  Also reviewed cost information and potential options for patient  assistance.   Current Medications: none Intolerances: atorvastatin, rosuvastatin (skin rash, peeling) Risk Factors: CAD, HTN LDL-C goal: <70  Diet:  Breakfast: overnight oats with blueberries (oats,chia seeds, milk, 1 tsp syrup) Lunch: yogurt, grapes, apples Dinner: chicken and mac and cheese, salad Snack: whole wheat crackers Drink: water, 1 soda per day, green tea with honey  Exercise: walks 1 miles per day (20 min)  Family History:  Family History  Problem Relation Age of Onset   Stroke Mother        died in his 80s   Heart Problems Father        Died in her 26s    Social History: hasn't smoked since MI, no ETOH  Labs: Lipid Panel     Component Value Date/Time   CHOL 90 (L) 04/30/2023 0940   TRIG 93 04/30/2023 0940   HDL 36 (L) 04/30/2023 0940   CHOLHDL 2.5 04/30/2023 0940   CHOLHDL 3 12/16/2021 0802   VLDL 18.8 12/16/2021 0802   LDLCALC 36 04/30/2023 0940   LABVLDL 18 04/30/2023 0940    Past Medical History:  Diagnosis Date   CAD in native artery 06/30/2019   HLD (hyperlipidemia) 06/30/2019   HTN (hypertension) - new dx 06/28/2019   NSTEMI (non-ST elevated myocardial infarction) ([facility]) 06/28/2019   S/P angioplasty with stent 06/29/19 DES to LCX OM  06/30/2019   Shingles 2019   Tobacco use 06/28/2019    Current Outpatient Medications on File Prior to Visit  Medication  Sig Dispense Refill   carvedilol (COREG) 12.5 MG tablet TAKE 1 TABLET (12.5MG  TOTAL) BY MOUTH TWICE A DAY WITH MEALS 180 tablet 3   clopidogrel (PLAVIX) 75 MG tablet Take 1 tablet (75 mg total) by mouth daily. 90 tablet 3   nitroGLYCERIN (NITROSTAT) 0.4 MG SL tablet PLACE 1 TABLET (0.4 MG TOTAL) UNDER THE TONGUE EVERY 5 MINUTES X 3 DOSES AS NEEDED FOR CHEST PAIN. 75 tablet 1   No current facility-administered medications on file prior to visit.    Allergies  Allergen Reactions   Statins Rash    redness, peeling, and occasional itching of hands    Assessment/Plan:  1. Hyperlipidemia  -  HLD (hyperlipidemia) Assessment: Allergic reaction to statins Patient refused any injections Reviewed Nexlizet and side effects Patient refused resistance training Walks 1 miles per day Needs baseline labs for insurance Per patient, if LDL-C is low- he will not take medication for it He prefers to get his labs done with RN at work  Plan: Lipid panel for baseline Submit to insurance for [employer]   Thank you,  Matthew Stanley, Pharm.D, BCACP, BCPS, CPP [facility] [address], [place], [place] [address]  Phone: [phone]; Fax: [phone]

## 2023-07-23 NOTE — Assessment & Plan Note (Signed)
Assessment: Allergic reaction to statins Patient refused any injections Reviewed Nexlizet and side effects Patient refused resistance training Walks 1 miles per day Needs baseline labs for insurance Per patient, if LDL-C is low- he will not take medication for it He prefers to get his labs done with RN at work  Plan: Lipid panel for baseline Submit to insurance for [employer]

## 2023-07-23 NOTE — Patient Instructions (Addendum)
Please go to the lab on the first floor for a lipid panel  We will submit a prior authorization for Nexlizet once your lab results come back. I will call you once I hear back from insurance.  Please call us at [phone] with any questions

## 2023-07-28 ENCOUNTER — Encounter: Payer: Self-pay | Admitting: Pharmacist

## 2023-07-30 ENCOUNTER — Other Ambulatory Visit (HOSPITAL_COMMUNITY): Payer: Self-pay

## 2023-07-30 ENCOUNTER — Telehealth: Payer: Self-pay | Admitting: Pharmacy Technician

## 2023-07-30 MED ORDER — NEXLIZET 180-10 MG PO TABS: 1.0000 | ORAL_TABLET | Freq: Every day | ORAL | 11 refills | Status: DC

## 2023-07-30 NOTE — Addendum Note (Signed)
Addended by: Malena Peer D on: 07/30/2023 11:18 AM   Modules accepted: Orders

## 2023-07-30 NOTE — Telephone Encounter (Addendum)
Pharmacy Patient Advocate Encounter   Received notification from Patient Advice Request messages that prior authorization for nexlizet is required/requested.   Insurance verification completed.   The patient is insured through [place].   Per test claim: The current 07/30/23 day co-pay is, $25.00- one month.  No PA needed at this time. This test claim was processed through [facility]- copay amounts may vary at other pharmacies due to pharmacy/plan contracts, or as the patient moves through the different stages of their insurance plan.

## 2023-09-28 ENCOUNTER — Telehealth: Payer: Self-pay | Admitting: Pharmacy Technician

## 2023-09-28 ENCOUNTER — Other Ambulatory Visit (HOSPITAL_COMMUNITY): Payer: Self-pay

## 2023-09-28 NOTE — Telephone Encounter (Signed)
 Pharmacy Patient Advocate Encounter  Received notification from [employer] that Prior Authorization for nexlizet has been APPROVED from 08/29/23 to 09/27/24. Ran test claim, Copay is $25.00- one month. This test claim was processed through [facility]- copay amounts may vary at other pharmacies due to pharmacy/plan contracts, or as the patient moves through the different stages of their insurance plan.   PA #/Case ID/Reference #: [id number]

## 2023-09-28 NOTE — Telephone Encounter (Signed)
 Pharmacy Patient Advocate Encounter   Received notification from CoverMyMeds that prior authorization for Nexlizet is required/requested.   Insurance verification completed.   The patient is insured through [employer] .   Per test claim: PA required; PA submitted to above mentioned insurance via CoverMyMeds Key/confirmation #/EOC [id number] Status is pending

## 2023-11-18 ENCOUNTER — Other Ambulatory Visit: Payer: Self-pay

## 2023-11-18 MED ORDER — NEXLIZET 180-10 MG PO TABS: 1.0000 | ORAL_TABLET | Freq: Every day | ORAL | 1 refills | Status: DC

## 2023-12-07 DIAGNOSIS — I10 Essential (primary) hypertension: Secondary | ICD-10-CM

## 2023-12-07 DIAGNOSIS — I2510 Atherosclerotic heart disease of native coronary artery without angina pectoris: Secondary | ICD-10-CM

## 2023-12-07 MED ORDER — CLOPIDOGREL BISULFATE 75 MG PO TABS: 75.0000 mg | ORAL_TABLET | Freq: Every day | ORAL | 3 refills | Status: DC

## 2023-12-07 MED ORDER — CARVEDILOL 12.5 MG PO TABS: 12.5000 mg | ORAL_TABLET | Freq: Two times a day (BID) | ORAL | 3 refills | Status: DC

## 2024-02-16 ENCOUNTER — Telehealth: Payer: Self-pay | Admitting: *Deleted

## 2024-02-16 NOTE — Telephone Encounter (Signed)
   Pre-operative Risk Assessment    Patient Name: Matthew Stanley  DOB: 1961/03/07 MRN: [id number]   Date of last office visit: 04/30/23 Matthew Stanley, [facility] Date of next office visit: NONE  Request for Surgical Clearance    Procedure:  Dental Extraction - Amount of Teeth to be Pulled:  LEFT MESSAGE HOW MANY EXTRACTIONS AS WELL AS IF SIMPLE OR SURGICAL EXTRACTIONS  Date of Surgery:  Clearance TBD                                Surgeon:  DR. BLANCHARD VINCENTE CROAK Surgeon's Group or Practice Name:  [facility] Phone number:  [phone] Fax number:  [phone]   Type of Clearance Requested:   - Medical  - Pharmacy:  Hold Clopidogrel  (Plavix )     Type of Anesthesia:  Local  WITH EPI   Additional requests/questions:    Matthew Stanley   02/16/2024, 10:21 AM

## 2024-02-17 NOTE — Telephone Encounter (Signed)
 2nd attempt to reach the DDS office for clarification, see notes.

## 2024-02-18 NOTE — Telephone Encounter (Signed)
 Spoke with dental office. Office stated pt is getting all of his upper and all of his lower teeth extracted. Fifteen teeth in total.

## 2024-02-19 NOTE — Telephone Encounter (Signed)
 Preop OV now scheduled

## 2024-02-19 NOTE — Telephone Encounter (Signed)
   Name: Cortland Crehan  DOB: [birth year]  MRN: [id number]  Primary Cardiologist: Candyce Reek, MD  Chart reviewed as part of pre-operative protocol coverage. Because of Shawon Denzer Benge's past medical history and time since last visit, he will require a follow-up in-office visit in order to better assess preoperative cardiovascular risk.  Pre-op covering staff: - Please schedule appointment and call patient to inform them. If patient already had an upcoming appointment within acceptable timeframe, please add pre-op clearance to the appointment notes so provider is aware. - Please contact requesting surgeon's office via preferred method (i.e, phone, fax) to inform them of need for appointment prior to surgery.   Felissa Blouch, [place]  02/19/2024, 3:12 PM

## 2024-02-21 NOTE — Progress Notes (Unsigned)
 Cardiology Office Note   Date:  02/25/2024  ID:  Pratyush, Ammon 12/10/1960, MRN [id number] PCP: Theophilus Andrews, Tully GRADE, MD  [facility] Providers Cardiologist:  Annabella Scarce, MD     [facility] Coronary artery disease S/p STEMI 11/18//2020 PCI/DES to left Cx and OM (2.25 x 38 mm Resolute Onyx stent) Mild residual disease in nondominant RCA, 2nd diagonal (very small caliber) and LAD Mildly reduced LVEF on TTE 06/29/2019 45-50% Hyperlipidemia Hypertension Right BBB Former tobacco abuse Allergic reaction to atorvastatin /rosuvastatin   Prior patient of Dr. Dann with prior STEMI with cath results as noted above. At office visit 04/30/2023 with Artist Pouch, PA, he was changed from DAPT to Plavix  monotherapy. BP was elevated but well controlled at home suggesting white coat hypertension. He was concerned that rash on his hands was caused by atorvastatin  so he was switched to rosuvastatin . Unfortunately, the rash persisted and he was referred to Lipid clinic.  Seen by Eleanor Crews, RPH on 07/23/23. He reported rash improved since stopping statins. He refused to take PCSK9i since they are injectable. LDL-C on labs at that time was 84. He agreed to start Nexlizet  180-10 mg daily.   History of Present Illness Discussed the use of AI scribe software for clinical note transcription with the patient, who gave verbal consent to proceed.  History of Present Illness Matthew Stanley is a very pleasant 63 year old male who presents for follow-up of CAD. He has not used nitroglycerin  in five years and has not experienced any chest pain since his heart attack. He has made significant lifestyle changes, including dietary modifications and increased physical activity. He walks daily, covering a mile and a quarter at work and two miles on weekends, and has reduced his weight from 225 pounds to 211 pounds. He's also very active at home with regular projects and push mowing and other yard  work. He's eating less saturated fat, sugar, and simple carbohydrates. His A1c has improved from 6.2 to 5.7%. He is attentive to any new pain and denies chest pain, shortness of breath, palpitations, orthopnea, PND, edema, presyncope, syncope.SABRA He previously had adverse reactions to statins, causing hand rashes, and has since discontinued them. He is currently doing well on Nexlezet. He monitors his blood pressure at home with three different devices, with BP generally < 120/80. He attributes higher readings in medical settings to 'white coat syndrome.' He quit smoking after his heart attack and has not smoked since. He is scheduled for extensive dental work and is here for preop evaluation.    ROS: See HPI  Studies Reviewed EKG Interpretation Date/Time:  Thursday February 25 2024 08:44:11 EDT Ventricular Rate:  60 PR Interval:  178 QRS Duration:  92 QT Interval:  394 QTC Calculation: 394 R Axis:   58  Text Interpretation: Normal sinus rhythm Normal ECG When compared with ECG of 30-Apr-2023 08:53, No significant change was found Confirmed by Percy Browning [phone]) on 02/25/2024 8:54:00 AM     No results found for: LIPOA  Risk Assessment/Calculations           Physical Exam VS:  BP 130/84   Pulse 67   Ht 5' 11 (1.803 m)   Wt 211 lb (95.7 kg)   SpO2 97%   BMI 29.43 kg/m    Wt Readings from Last 3 Encounters:  02/25/24 211 lb (95.7 kg)  04/30/23 224 lb 9.6 oz (101.9 kg)  05/01/22 215 lb (97.5 kg)    GEN: Well nourished, well developed  in no acute distress NECK: No JVD; No carotid bruits CARDIAC: RRR, no murmurs, rubs, gallops RESPIRATORY:  Clear to auscultation without rales, wheezing or rhonchi  ABDOMEN: Soft, non-tender, non-distended EXTREMITIES:  No edema; No deformity   Assessment & Plan Preoperative cardiac evaluation According to the Revised Cardiac Risk Index (RCRI), his Perioperative Risk of Major Cardiac Event is (%): 0.9. His Functional Capacity in METs is: 8.23  according to the Duke Activity Status Index (DASI). The patient is doing well from a cardiac perspective. Therefore, based on ACC/AHA guidelines, the patient would be at acceptable risk for the planned procedure without further cardiovascular testing. Per office protocol, he may hold Plavix  for 5 days prior to procedure and should resume as soon as hemodynamically stable postoperatively. I will forward clearance to requesting provider.   CAD History of STEMI with subsequent PCI/DES to left Cx and OM (2.25 x 38 mm Resolute Onyx stent), mild residual disease in nondominant RCA, 2nd diagonal (very small caliber) and LAD. He has no recurrent symptoms or limitations and does not have any symptoms concerning for angina with exertion. EKG today is unremarkable. LDL is at goal of < 70.  No bleeding concerns on clopidogrel . -Will discontinue refills of nitroglycerin  per his request as he has never used it. -Continue lifestyle modifications, including heart healthy diet and regular moderate exercise along with regular physical activity - Continue clopidogrel , carvedilol , Nexlizet   Hyperlipidemia  LDL goal < 70 Statin intolerance He reports recent lipid panel with NP at his place of employment.  Reports LDL in the 30s.  No concerning side effects on Nexlizet .  -Asked him to fax lab results to our office -Continue Nexlizet  -Continue heart healthy diet limiting sugar (along with caution regarding sugar-free snacks), saturated fat, processed food and simple carbohydrates -Continue active lifestyle  Hypertension   BP is well-controlled at home.  He has higher readings in the office secondary to whitecoat hypertension -continue carvedilol   Prediabetes, diet controlled Glycemic control has improved with A1c reduced to 5.7%.  -Continue healthy diet and regular physical activity -Management per PCP        Dispo: 1 year with Dr. Raford or APP (transfer from Dr. Dann)   Signed, Rosaline Bane,  NP-C

## 2024-02-25 ENCOUNTER — Encounter (HOSPITAL_BASED_OUTPATIENT_CLINIC_OR_DEPARTMENT_OTHER): Payer: Self-pay | Admitting: Nurse Practitioner

## 2024-02-25 ENCOUNTER — Ambulatory Visit (HOSPITAL_BASED_OUTPATIENT_CLINIC_OR_DEPARTMENT_OTHER): Admitting: Nurse Practitioner

## 2024-02-25 ENCOUNTER — Encounter (HOSPITAL_BASED_OUTPATIENT_CLINIC_OR_DEPARTMENT_OTHER): Payer: Self-pay

## 2024-02-25 VITALS — BP 130/84 | HR 67 | Ht 71.0 in | Wt 211.0 lb

## 2024-02-25 DIAGNOSIS — E785 Hyperlipidemia, unspecified: Secondary | ICD-10-CM

## 2024-02-25 DIAGNOSIS — I10 Essential (primary) hypertension: Secondary | ICD-10-CM

## 2024-02-25 DIAGNOSIS — Z01810 Encounter for preprocedural cardiovascular examination: Secondary | ICD-10-CM

## 2024-02-25 DIAGNOSIS — I2510 Atherosclerotic heart disease of native coronary artery without angina pectoris: Secondary | ICD-10-CM

## 2024-02-25 DIAGNOSIS — R7303 Prediabetes: Secondary | ICD-10-CM

## 2024-02-25 DIAGNOSIS — Z789 Other specified health status: Secondary | ICD-10-CM

## 2024-02-25 NOTE — Patient Instructions (Signed)
 Medication Instructions:   Your physician recommends that you continue on your current medications as directed. Please refer to the Current Medication list given to you today.   *If you need a refill on your cardiac medications before your next appointment, please call your pharmacy*  Lab Work:  None ordered.  If you have labs (blood work) drawn today and your tests are completely normal, you will receive your results only by: MyChart Message (if you have MyChart) OR A paper copy in the mail If you have any lab test that is abnormal or we need to change your treatment, we will call you to review the results.  Testing/Procedures:  None ordered.   Follow-Up: At [facility], you and your health needs are our priority.  As part of our continuing mission to provide you with exceptional heart care, our providers are all part of one team.  This team includes your primary Cardiologist (physician) and Advanced Practice Providers or APPs (Physician Assistants and Nurse Practitioners) who all work together to provide you with the care you need, when you need it.  Your next appointment:   1 year(s)  Provider:   Annabella Scarce, MD, Rosaline Bane, NP, or Reche Finder, NP    We recommend signing up for the patient portal called MyChart.  Sign up information is provided on this After Visit Summary.  MyChart is used to connect with patients for Virtual Visits (Telemedicine).  Patients are able to view lab/test results, encounter notes, upcoming appointments, etc.  Non-urgent messages can be sent to your provider as well.   To learn more about what you can do with MyChart, go to [email or web address].   Other Instructions  Your physician wants you to follow-up in: 1 year.  You will receive a reminder letter in the mail two months in advance. If you don't receive a letter, please call our office to schedule the follow-up appointment.

## 2024-03-09 ENCOUNTER — Telehealth: Payer: Self-pay

## 2024-03-09 NOTE — Telephone Encounter (Signed)
 Attempted to contact surgeons' office to receive clarification on mutual patient's preop clearance request regarding what procedure will be done and if extractions how many. [id number]

## 2024-03-09 NOTE — Telephone Encounter (Signed)
Will forward to pre op APP to review if pt has been cleared.

## 2024-03-09 NOTE — Telephone Encounter (Signed)
 Dental office calling because the say they have not received clearance back and procedure is tomorrow. Please fax to [phone]

## 2024-03-30 ENCOUNTER — Encounter (HOSPITAL_BASED_OUTPATIENT_CLINIC_OR_DEPARTMENT_OTHER): Payer: Self-pay

## 2024-03-30 MED ORDER — NEXLIZET 180-10 MG PO TABS: 1.0000 | ORAL_TABLET | Freq: Every day | ORAL | 1 refills | Status: AC

## 2024-08-25 ENCOUNTER — Encounter (HOSPITAL_BASED_OUTPATIENT_CLINIC_OR_DEPARTMENT_OTHER): Payer: Self-pay

## 2024-08-25 MED ORDER — CLOPIDOGREL BISULFATE 75 MG PO TABS: 75.0000 mg | ORAL_TABLET | Freq: Every day | ORAL | 1 refills | Status: AC

## 2024-09-01 ENCOUNTER — Other Ambulatory Visit: Payer: Self-pay | Admitting: Cardiology

## 2024-09-01 DIAGNOSIS — I2510 Atherosclerotic heart disease of native coronary artery without angina pectoris: Secondary | ICD-10-CM

## 2024-09-01 DIAGNOSIS — I10 Essential (primary) hypertension: Secondary | ICD-10-CM

## 2024-09-08 NOTE — Telephone Encounter (Signed)
 In accordance with refill protocols, please review and address the following requirements before this medication refill can be authorized:  Labs
